# Patient Record
Sex: Male | Born: 1965 | Race: White | Hispanic: No | Marital: Married | Smoking: Never smoker
Health system: Southern US, Community
[De-identification: ages and names within clinical notes are randomized; demographics above are authoritative.]

## PROBLEM LIST (undated history)

## (undated) DIAGNOSIS — M1990 Unspecified osteoarthritis, unspecified site: Secondary | ICD-10-CM

## (undated) DIAGNOSIS — F329 Major depressive disorder, single episode, unspecified: Secondary | ICD-10-CM

## (undated) DIAGNOSIS — J018 Other acute sinusitis: Secondary | ICD-10-CM

## (undated) DIAGNOSIS — R51 Headache: Secondary | ICD-10-CM

## (undated) DIAGNOSIS — E785 Hyperlipidemia, unspecified: Secondary | ICD-10-CM

## (undated) DIAGNOSIS — K429 Umbilical hernia without obstruction or gangrene: Secondary | ICD-10-CM

## (undated) DIAGNOSIS — I10 Essential (primary) hypertension: Secondary | ICD-10-CM

## (undated) DIAGNOSIS — I8390 Asymptomatic varicose veins of unspecified lower extremity: Secondary | ICD-10-CM

## (undated) DIAGNOSIS — Z973 Presence of spectacles and contact lenses: Secondary | ICD-10-CM

## (undated) DIAGNOSIS — R748 Abnormal levels of other serum enzymes: Secondary | ICD-10-CM

## (undated) DIAGNOSIS — M1710 Unilateral primary osteoarthritis, unspecified knee: Secondary | ICD-10-CM

## (undated) DIAGNOSIS — K219 Gastro-esophageal reflux disease without esophagitis: Secondary | ICD-10-CM

## (undated) DIAGNOSIS — N529 Male erectile dysfunction, unspecified: Secondary | ICD-10-CM

## (undated) HISTORY — DX: Major depressive disorder, single episode, unspecified: F32.9

## (undated) HISTORY — DX: Asymptomatic varicose veins of unspecified lower extremity: I83.90

## (undated) HISTORY — DX: Essential (primary) hypertension: I10

## (undated) HISTORY — DX: Other acute sinusitis: J01.80

## (undated) HISTORY — DX: Gastro-esophageal reflux disease without esophagitis: K21.9

## (undated) HISTORY — DX: Unilateral primary osteoarthritis, unspecified knee: M17.10

## (undated) HISTORY — PX: COLONOSCOPY WITH PROPOFOL: SHX5780

## (undated) HISTORY — DX: Abnormal levels of other serum enzymes: R74.8

## (undated) HISTORY — DX: Headache: R51

## (undated) HISTORY — DX: Hyperlipidemia, unspecified: E78.5

---

## 2005-07-14 ENCOUNTER — Encounter: Admission: RE | Admit: 2005-07-14 | Discharge: 2005-07-14 | Payer: Self-pay | Admitting: Family Medicine

## 2009-07-17 ENCOUNTER — Ambulatory Visit: Payer: Self-pay | Admitting: Internal Medicine

## 2009-07-17 DIAGNOSIS — R42 Dizziness and giddiness: Secondary | ICD-10-CM | POA: Insufficient documentation

## 2009-07-17 DIAGNOSIS — R51 Headache: Secondary | ICD-10-CM | POA: Insufficient documentation

## 2009-07-17 DIAGNOSIS — R519 Headache, unspecified: Secondary | ICD-10-CM | POA: Insufficient documentation

## 2009-07-17 DIAGNOSIS — K219 Gastro-esophageal reflux disease without esophagitis: Secondary | ICD-10-CM | POA: Insufficient documentation

## 2009-07-17 DIAGNOSIS — I10 Essential (primary) hypertension: Secondary | ICD-10-CM | POA: Insufficient documentation

## 2009-07-24 ENCOUNTER — Ambulatory Visit: Payer: Self-pay | Admitting: Internal Medicine

## 2009-07-24 LAB — CONVERTED CEMR LAB
ALT: 38 units/L (ref 0–53)
BUN: 16 mg/dL (ref 6–23)
Basophils Relative: 0.3 % (ref 0.0–3.0)
Bilirubin Urine: NEGATIVE
CO2: 30 meq/L (ref 19–32)
Calcium: 9.1 mg/dL (ref 8.4–10.5)
Chloride: 101 meq/L (ref 96–112)
Eosinophils Absolute: 0.3 10*3/uL (ref 0.0–0.7)
Hemoglobin: 14.1 g/dL (ref 13.0–17.0)
Leukocytes, UA: NEGATIVE
MCHC: 34.1 g/dL (ref 30.0–36.0)
MCV: 88.5 fL (ref 78.0–100.0)
Monocytes Relative: 14.4 % — ABNORMAL HIGH (ref 3.0–12.0)
Neutrophils Relative %: 38.7 % — ABNORMAL LOW (ref 43.0–77.0)
Nitrite: NEGATIVE
Platelets: 295 10*3/uL (ref 150.0–400.0)
Potassium: 3.9 meq/L (ref 3.5–5.1)
Total Bilirubin: 1 mg/dL (ref 0.3–1.2)
Triglycerides: 146 mg/dL (ref 0.0–149.0)
WBC: 5.7 10*3/uL (ref 4.5–10.5)

## 2009-08-01 ENCOUNTER — Encounter: Payer: Self-pay | Admitting: Internal Medicine

## 2009-10-16 ENCOUNTER — Ambulatory Visit: Payer: Self-pay | Admitting: Internal Medicine

## 2009-10-16 DIAGNOSIS — J018 Other acute sinusitis: Secondary | ICD-10-CM | POA: Insufficient documentation

## 2009-10-16 DIAGNOSIS — E785 Hyperlipidemia, unspecified: Secondary | ICD-10-CM | POA: Insufficient documentation

## 2009-10-16 LAB — CONVERTED CEMR LAB
ALT: 32 units/L (ref 0–53)
Albumin: 4.8 g/dL (ref 3.5–5.2)
Alkaline Phosphatase: 126 units/L — ABNORMAL HIGH (ref 39–117)
HDL: 28 mg/dL — ABNORMAL LOW (ref >39)
Indirect Bilirubin: 0.4 mg/dL (ref 0.0–0.9)
LDL Cholesterol: 90 mg/dL (ref 0–99)
Total Bilirubin: 0.5 mg/dL (ref 0.3–1.2)
Triglycerides: 163 mg/dL — ABNORMAL HIGH (ref <150)
VLDL: 33 mg/dL (ref 0–40)

## 2009-10-19 ENCOUNTER — Encounter: Payer: Self-pay | Admitting: Internal Medicine

## 2009-10-20 ENCOUNTER — Telehealth: Payer: Self-pay | Admitting: Internal Medicine

## 2009-10-20 ENCOUNTER — Encounter: Payer: Self-pay | Admitting: Internal Medicine

## 2009-10-20 DIAGNOSIS — R748 Abnormal levels of other serum enzymes: Secondary | ICD-10-CM | POA: Insufficient documentation

## 2010-04-01 ENCOUNTER — Encounter: Payer: Self-pay | Admitting: Internal Medicine

## 2010-04-16 ENCOUNTER — Ambulatory Visit: Payer: Self-pay | Admitting: Internal Medicine

## 2010-04-16 DIAGNOSIS — IMO0002 Reserved for concepts with insufficient information to code with codable children: Secondary | ICD-10-CM | POA: Insufficient documentation

## 2010-04-16 DIAGNOSIS — I8390 Asymptomatic varicose veins of unspecified lower extremity: Secondary | ICD-10-CM | POA: Insufficient documentation

## 2010-04-16 DIAGNOSIS — M1710 Unilateral primary osteoarthritis, unspecified knee: Secondary | ICD-10-CM

## 2010-04-16 LAB — CONVERTED CEMR LAB
Albumin: 5.1 g/dL (ref 3.5–5.2)
Alkaline Phosphatase: 88 units/L (ref 39–117)
Calcium: 9.7 mg/dL (ref 8.4–10.5)
Cholesterol: 170 mg/dL (ref 0–200)
Creatinine, Ser: 1.04 mg/dL (ref 0.40–1.50)
Glucose, Bld: 99 mg/dL (ref 70–99)
HDL: 40 mg/dL (ref >39)
LDL Cholesterol: 80 mg/dL (ref 0–99)
Potassium: 4.2 meq/L (ref 3.5–5.3)
Total Bilirubin: 0.4 mg/dL (ref 0.3–1.2)
Total CHOL/HDL Ratio: 4.3

## 2010-04-17 ENCOUNTER — Encounter: Payer: Self-pay | Admitting: Internal Medicine

## 2010-06-10 ENCOUNTER — Encounter: Payer: Self-pay | Admitting: Internal Medicine

## 2010-06-10 ENCOUNTER — Ambulatory Visit: Payer: Self-pay | Admitting: Vascular Surgery

## 2010-09-15 ENCOUNTER — Ambulatory Visit: Payer: Self-pay | Admitting: Vascular Surgery

## 2010-09-15 ENCOUNTER — Encounter: Payer: Self-pay | Admitting: Internal Medicine

## 2010-10-28 ENCOUNTER — Ambulatory Visit: Payer: Self-pay | Admitting: Vascular Surgery

## 2010-11-04 ENCOUNTER — Ambulatory Visit: Payer: Self-pay | Admitting: Vascular Surgery

## 2010-11-05 ENCOUNTER — Encounter: Payer: Self-pay | Admitting: Internal Medicine

## 2011-01-05 NOTE — Assessment & Plan Note (Signed)
Summary: 6 mo.f/u- jr   Vital Signs:  Patient profile:   45 year old male Height:      72 inches Weight:      252.75 pounds BMI:     34.40 O2 Sat:      99 % on Room air Temp:     98.1 degrees F oral Pulse rate:   62 / minute Pulse rhythm:   regular Resp:     16 per minute BP sitting:   118 / 72  (right arm) Cuff size:   large  Vitals Entered By: Glendell Docker CMA (Apr 16, 2010 4:08 PM)  O2 Flow:  Room air CC: Rm 2- 6 Month Follow up diseaese management, CHF Management   Primary Care Provider:  DThomos Lemons DO  CC:  Rm 2- 6 Month Follow up diseaese management and CHF Management.  History of Present Illness:  Hyperlipidemia Follow-Up      This is a 45 year old man who presents for Hyperlipidemia follow-up.  The patient denies muscle aches and GI upset.  The patient denies the following symptoms: chest pain/pressure.  Compliance with medications (by patient report) has been near 100%.  Dietary compliance has been good.  The patient reports exercising 3-4X per week.  Adjunctive measures currently used by the patient include weight reduction.  he has lost approximately 40 pounds since 2010  Right varicose veins - legs feel achy and varicose veins are warm to touch  he c/o occ right knee pain, worse with prolonged standing, no swelling, no redness he has been taking daily ibuprofen for years  he complains he had been urinating last night No associated urinary symptoms He has been taking over-the-counter allergy medication   Preventive Screening-Counseling & Management  Alcohol-Tobacco     Smoking Status: never  Allergies (verified): No Known Drug Allergies  Past History:  Past Medical History: Dizziness or vertigo GERD Headache   Hypertension   Lower extremity Varicosities  Past Surgical History: Denies surgical history        Family History: Family History of Alcoholism/Addiction- Father Family History High cholesterol-mother Family History Hypertension-  mother and maternal grand parents Family History Lung cancer Maternal grandafather age 53 Family History of Stroke paternal grandfatther       Social History: Occupation: Network engineer) Married 7 years 2 daughters 5, 8  2 son 4 and 18 months      Impression & Recommendations:  Problem # 1:  ALKALINE PHOSPHATASE, ELEVATED (ICD-88.13) 45 year old white male with isolated elevation at alkaline phosphatase. Repeat LFTs.   check GGT if persistent abnormality, consider liver ultrasound Orders: T-Hepatic Function ([id number]) T- * Misc. Laboratory test [phone])  Problem # 2:  HYPERLIPIDEMIA (ICD-272.4) patient previously taking simvastatin for primary prevention. Patient able to make significant lifestyle changes including 40 pound weight loss. Discontinue simvastatin.  Nonpharmacologic treatments for hyperlipidemia reviewed.  The following medications were removed from the medication list:    Simvastatin 20 Mg Tabs (Simvastatin) ..... 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 Refer to vein specialist Orders: Vascular Clinic (Vascular)  Problem # 4:  DEGENERATIVE JOINT DISEASE, KNEE (ICD-715.96) patient experiencing intermittent right knee stiffness. I suspect mild degenerative joint disease. Patient advised against long-term use of NSAIDs. Patient advised to use ibuprofen only as needed.    Complete Medication List: 1)  Zantac 150 Mg Tabs (Ranitidine hcl) .... Take  1 tablet by mouth two times a day  Other Orders: T-Basic Metabolic Panel [id number])  CHF Assessment/Plan:      The patient's current weight is 252.75 pounds.  His previous weight was 266 pounds.     Review of Systems  The patient denies fever, chest pain, and dyspnea on exertion.    Physical Exam  General:  alert, well-developed, and well-nourished.    Lungs:  normal respiratory effort and normal breath sounds.   Heart:  normal rate, regular rhythm, no murmur, and no gallop.   Msk:  right knee-normal ROM, no joint tenderness, no joint swelling, no joint warmth, no redness over joints, and no joint instability.   Skin:  large varicosities right lower extremity/right thigh no redness, mild tenderness   Patient Instructions: 1)  Please schedule a follow-up appointment in 1 year 2)  Use metamucile once daily 3)  Take fish oil supplement daily 4)  Use ibuprofen only as needed  Current Allergies (reviewed today): No known allergies

## 2011-01-05 NOTE — Miscellaneous (Signed)
Summary: Procedure Consent/[facility]  Procedure Consent/[facility]   Imported By: Lester Circle 04/06/2010 09:24:28  _____________________________________________________________________  External Attachment:    Type:   Image     Comment:   External Document

## 2011-01-05 NOTE — Letter (Signed)
Summary: [facility]  [facility] [place]   Imported By: Lanelle Bal 11/12/2010 16:08:43  _____________________________________________________________________  External Attachment:    Type:   Image     Comment:   External Document

## 2011-01-05 NOTE — Letter (Signed)
Summary: [facility]  [facility] [place]   Imported By: Lanelle Bal 10/12/2010 13:37:41  _____________________________________________________________________  External Attachment:    Type:   Image     Comment:   External Document

## 2011-01-05 NOTE — Letter (Signed)
   [place] [facility] [address] [place], [place]  [address]  [place] Phone: [phone]      Apr 17, 2010   Angel Merritt [address] [place], [place] [address]  RE:  LAB RESULTS  Dear  [name],  The following is an interpretation of your most recent lab tests.  Please take note of any instructions provided or changes to medications that have resulted from your lab work.  ELECTROLYTES:  Good - no changes needed  KIDNEY FUNCTION TESTS:  Good - no changes needed  LIVER FUNCTION TESTS:  Good - no changes needed  LIPID PANEL:  Stable - no changes needed Triglyceride: 248   Cholesterol: 170   LDL: 80   HDL: 40   Chol/HDL%:  4.3 Ratio   Triglycerides elevated - non fasting blood work       Sincerely Yours,    Dr. Thomos Lemons

## 2011-01-05 NOTE — Consult Note (Signed)
Summary: [facility]  [facility] [place]   Imported By: Lanelle Bal 06/30/2010 10:56:42  _____________________________________________________________________  External Attachment:    Type:   Image     Comment:   External Document

## 2011-04-20 NOTE — Procedures (Signed)
DUPLEX DEEP VENOUS EXAM - LOWER EXTREMITY   INDICATION:  One week post right greater saphenous vein ELAS and  phlebectomies.   HISTORY:  Edema:  No.  Trauma/Surgery:  Right greater saphenous vein ELAS on 10/28/10 and  phlebectomies.  Pain:  Yes.  PE:  No.  Previous DVT:  No.  Anticoagulants:  No.  Other:   DUPLEX EXAM:                CFV   SFV   PopV  PTV    GSV                R  L  R  L  R  L  R   L  R  L  Thrombosis    o     o     o     o      +  Spontaneous   +     +     +     +      o  Phasic        +     +     +     +      o  Augmentation  +     +     +     +      o  Compressible  +     +     +     +      o  Competent     +     +     +     +      o   Legend:  + - yes  o - no  p - partial  D - decreased   IMPRESSION:  Right leg appears to be negative for deep venous  thrombosis.  Greater saphenous vein is thrombosed due to ELAS.    _____________________________  Larina Earthly, M.D.   NT/MEDQ  D:  11/04/2010  T:  11/04/2010  Job:  [id number]

## 2011-04-20 NOTE — Consult Note (Signed)
NEW PATIENT CONSULTATION   Angel Merritt, Angel Merritt  DOB:  09-21-1966                                       06/10/2010  BJYNW#:[id number]   The patient presents today for evaluation of venous hypertension  varicosities, more so on his right leg than on his left leg.  He reports  these been present for quite some time and he has had progression over  the past several years both in the extensive nature of these and also  discomfort related to these.  He works as a Curator and stands for  prolonged shifts.  He reports increasing pain, aching and burning  sensation and swelling over the varicosities.  He does not have any  history of DVT or superficial thrombophlebitis and does not have any  history of bleeding.  His health is otherwise excellent with no major  medical problems.  He does take Zantac on a p.r.n. basis.  Otherwise he  is only on nutritional supplements.  He denies any cardiac, pulmonary  disease.   FAMILY HISTORY:  Significant for premature atherosclerotic disease in  his mother.   SOCIAL HISTORY:  He is married with 4 children.  He does not smoke or  drink alcohol.   REVIEW OF SYSTEMS:  Positive for weight loss down to 252 pounds.  He is  6 feet tall.  CARDIAC:  Negative.  PULMONARY:  Negative.  GI:  Negative.  GU:  Negative.  VASCULAR:  Positive for pain with prolonged standing and walking.  NEUROLOGIC:  Occasional dizziness.  MUSCULOSKELETAL:  Positive for joint pain.  PSYCHIATRIC:  Negative.  ENT:  Negative.  HEMATOLOGIC:  Negative.  SKIN:  Negative.   PHYSICAL EXAMINATION:  Well-developed, well-nourished white male  appearing his stated age.  Vital signs:  Blood pressure is 100/70, pulse  64, respirations 20.  HEENT:  Normal.  He has 2+ dorsalis pedis pulses  bilaterally.  Musculoskeletal:  Shows no major deformities or cyanosis.  Neurologic:  No focal weakness, paresthesias.  Skin:  Without ulcers or  rashes.  He does have marked saphenous  tributary varicosities beginning  in his medial thigh, extending through his knee and in his posterior  calf.  He underwent venous duplex today.  This does show reflux  throughout his great saphenous vein on the right.  On the left he does  have some mild reflux in the proximal segment only.  He does have deep  venous reflux as well on the right.   I discussed options with the patient.  I feel that his pain is related  to the superficial venous hypertension.  I discussed treatment options.  He has not worn compression garments and we have fitted today with thigh-  high graduated 20 to 30 mmHg compression garments.  He is instructed on  the use of these and to continue elevation and ibuprofen for discomfort.  We will see him again in 3 months for continued discussion.  I do feel  that he would be an excellent candidate for laser ablation of his  saphenous vein with stab phlebectomy of his multiple tributary  varicosities should he fail conservative treatment.  We will see him  again in 3 months.     Larina Earthly, M.D.  Electronically Signed   TFE/MEDQ  D:  06/10/2010  T:  06/10/2010  Job:  [id number]   cc:  Barbette Hair. Artist Pais, DO

## 2011-04-20 NOTE — Assessment & Plan Note (Signed)
OFFICE VISIT   Angel Merritt, Angel Merritt  DOB:  02/22/1966                                       09/15/2010  JYNWG#:[id number]   Patient presents today for continued discussion of his severe venous  hypertension in his right leg.  He was initially seen for formal duplex  evaluation in July 2011.  This revealed reflux throughout his right  great saphenous vein with multiple tributaries vessels extending off of  this.  He has worn his graduated compression stockings, and this has  provided no relief.  He continues to elevate legs when possible and does  take ibuprofen for discomfort.  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  He also reports  that yard work and household chores with squatting position are very  difficult due to leg pain and also reports prolonged standing on  concrete at work is difficult due to leg pain over the varicosities as  well.   His physical exam is unchanged.  He has a palpable dorsalis pedis pulse  on the right.  He has some marked tributary varicosities throughout his  right thigh extending into his calf.  On SonoSite visualization by  myself, this does arise from this great saphenous vein.   I feel that he has clearly failed conservative treatment and have  recommended laser ablation of his right great saphenous vein and stab  phlebectomy of multiple tributary varicosities.  He understands this,  and we will proceed at his convenience.  He understands this is an  outpatient procedure under local anesthesia.     Larina Earthly, M.D.  Electronically Signed   TFE/MEDQ  D:  09/15/2010  T:  09/16/2010  Job:  [id number]   cc:   Barbette Hair. Artist Pais, DO

## 2011-04-20 NOTE — Assessment & Plan Note (Signed)
OFFICE VISIT   Angel Merritt, Angel Merritt  DOB:  1966-06-18                                       10/28/2010  ZOXWR#:[id number]   Patient presents today for treatment of his right leg venous  hypertension.  He underwent successful laser ablation of his right great  saphenous vein from his level of his knee to just below the  saphenofemoral junction and stab phlebectomy of multiple tributary  varicosities over his thigh, popliteal fossa, and posterior calf.  He  had no immediate complication and was discharged to home.  He will be  seen again in 1 week for repeat duplex.     Larina Earthly, M.D.  Electronically Signed   TFE/MEDQ  D:  10/28/2010  T:  10/28/2010  Job:  [id number]

## 2011-04-20 NOTE — Procedures (Signed)
LOWER EXTREMITY VENOUS REFLUX EXAM   INDICATION:  Large ropey bulging painful varicose veins.   EXAM:  Using color-flow imaging and pulse Doppler spectral analysis, the  right and left common femoral, superficial femoral, popliteal, posterior  tibial, greater and lesser saphenous veins are evaluated.  There is  evidence suggesting deep venous insufficiency in the right and left  lower extremity.   The right and left saphenofemoral junction is not competent with reflux  of >500 mm per second.  The right GSV is not competent with reflux of  >500 mm per second with the caliber as described below.   The right and left proximal short saphenous vein demonstrates  competency.   GSV Diameter (used if found to be incompetent only)                                            Right         Left  Proximal Greater Saphenous Vein           0.70 cm       cm  Proximal-to-mid-thigh                     0.75 cm       cm  Mid thigh                                 0.56 cm       cm  Mid-distal thigh                          0.47 cm       cm  Distal thigh                              0.66 cm       cm  Knee                                      0.49 cm       cm   IMPRESSION:  1. The right greater saphenous vein reflux is >500 mm per second and      is identified with the calibers ranging from 0.70 cm to 0.49 cm      knee to groin.  2. The right greater saphenous vein is not aneurysmal.  3. The right greater saphenous vein is not tortuous.  4. The deep venous system is not competent with reflux of >500 mm per      second.  5. The right and left lesser saphenous veins are competent.   ___________________________________________  Larina Earthly, M.D.   NT/MEDQ  D:  06/10/2010  T:  06/10/2010  Job:  [id number]

## 2011-04-23 NOTE — Assessment & Plan Note (Signed)
OFFICE VISIT   Angel Merritt, Angel Merritt  DOB:  Nov 17, 1966                                       11/05/2010  ZOXWR#:[id number]   The patient presented today for 1-week followup of the laser ablation of  his right great saphenous vein from his proximal calf to just below the  saphenofemoral junction and stab phlebectomy of multiple tributaries  varicosities.  He did extremely well with minimal discomfort and  moderate amount of bruising following the procedure.  He has been  compliant wearing his compression garments as directed.  His stab wound  sites all look quite good and he has minimal distal swelling.  He  underwent repeat venous duplex today and this shows successful closure  of his saphenous vein throughout the treated area and no evidence of  DVT.  I am quite pleased with his result as is Mr. Ayoub.  He will  notify us should he develop any difficulty in the future.     Larina Earthly, M.D.  Electronically Signed   TFE/MEDQ  D:  11/04/2010  T:  11/05/2010  Job:  [id number]   cc:   Barbette Hair. Artist Pais, DO

## 2013-05-23 ENCOUNTER — Encounter: Payer: Self-pay | Admitting: Family

## 2013-05-23 ENCOUNTER — Ambulatory Visit (INDEPENDENT_AMBULATORY_CARE_PROVIDER_SITE_OTHER): Payer: Managed Care, Other (non HMO) | Admitting: Family

## 2013-05-23 VITALS — BP 128/80 | HR 77 | Temp 98.6°F | Ht 69.0 in | Wt 297.8 lb

## 2013-05-23 DIAGNOSIS — Z0000 Encounter for general adult medical examination without abnormal findings: Secondary | ICD-10-CM | POA: Insufficient documentation

## 2013-05-23 LAB — HEPATIC FUNCTION PANEL
Bilirubin, Direct: 0.1 mg/dL (ref 0.0–0.3)
Total Bilirubin: 0.4 mg/dL (ref 0.3–1.2)
Total Protein: 7.1 g/dL (ref 6.0–8.3)

## 2013-05-23 LAB — URINALYSIS, ROUTINE W REFLEX MICROSCOPIC
Bilirubin Urine: NEGATIVE
Nitrite: NEGATIVE
Protein, ur: NEGATIVE mg/dL
Specific Gravity, Urine: 1.022 (ref 1.005–1.030)
pH: 5.5 (ref 5.0–8.0)

## 2013-05-23 LAB — CBC WITH DIFFERENTIAL/PLATELET
Eosinophils Absolute: 0.3 10*3/uL (ref 0.0–0.7)
Lymphocytes Relative: 34 % (ref 12–46)
MCH: 28.7 pg (ref 26.0–34.0)
MCV: 84 fL (ref 78.0–100.0)
Monocytes Relative: 10 % (ref 3–12)
Neutrophils Relative %: 52 % (ref 43–77)
Platelets: 337 10*3/uL (ref 150–400)
WBC: 9.1 10*3/uL (ref 4.0–10.5)

## 2013-05-23 LAB — TSH: TSH: 0.928 u[IU]/mL (ref 0.350–4.500)

## 2013-05-23 LAB — LIPID PANEL
Cholesterol: 221 mg/dL — ABNORMAL HIGH (ref 0–200)
HDL: 41 mg/dL (ref >39)
Total CHOL/HDL Ratio: 5.4 Ratio
VLDL: 46 mg/dL — ABNORMAL HIGH (ref 0–40)

## 2013-05-23 NOTE — Progress Notes (Signed)
Subjective:    Patient ID: Angel Merritt, male    DOB: Apr 07, 1966, 47 y.o.   MRN: [id number]  HPI  [name] "Angel Merritt" is a 48 yr old male who presents today to re-establish care. He was last seen by Dr. Artist Pais in 2010.    Immunizations: tetanus up to date.   Diet: poor diet Exercise:  Colonoscopy: dad diagnosed with colon cancer at 64.     Review of Systems  Constitutional: Positive for unexpected weight change.  HENT: Negative for congestion.   Eyes: Negative for visual disturbance.  Respiratory: Negative for cough and shortness of breath.   Cardiovascular: Negative for chest pain.  Gastrointestinal: Negative for nausea, vomiting and diarrhea.  Genitourinary: Negative for dysuria and frequency.  Musculoskeletal: Negative for myalgias and arthralgias.  Skin: Negative for rash.  Neurological: Negative for headaches.  Hematological: Negative for adenopathy.  Psychiatric/Behavioral:       Denies depression/anxiety   Past Medical History  Diagnosis Date  . VARICOSE VEINS, LOWER EXTREMITIES 04/16/2010    Qualifier: Diagnosis of  By: Nena Jordan   . RHINOSINUSITIS, ACUTE 10/16/2009    Qualifier: Diagnosis of  By: Nena Jordan   . HYPERTENSION 07/17/2009    Qualifier: Diagnosis of  By: Terrilee Croak CMA, Darlene    . HYPERLIPIDEMIA 10/16/2009    Qualifier: Diagnosis of  By: Nena Jordan   . HEADACHE 07/17/2009    Qualifier: Diagnosis of  By: Terrilee Croak CMA, Darlene    . GERD 07/17/2009    Qualifier: Diagnosis of  By: Terrilee Croak CMA, Darlene    . DEGENERATIVE JOINT DISEASE, KNEE 04/16/2010    Qualifier: Diagnosis of  By: Nena Jordan   . ALKALINE PHOSPHATASE, ELEVATED 10/20/2009    Qualifier: Diagnosis of  By: Nena Jordan     History   Social History  . Marital Status: Married    Spouse Name: N/A    Number of Children: N/A  . Years of Education: N/A   Occupational History  . Not on file.   Social History Main Topics  . Smoking status: Never Smoker   .  Smokeless tobacco: Not on file  . Alcohol Use: No  . Drug Use: No  . Sexually Active: Not on file   Other Topics Concern  . Not on file   Social History Narrative  . No narrative on file    No past surgical history on file.  No family history on file.  No Known Allergies  No current outpatient prescriptions on file prior to visit.   No current facility-administered medications on file prior to visit.    BP 128/80  Pulse 77  Temp(Src) 98.6 F (37 C) (Oral)  Ht 5\' 9"  (1.753 m)  Wt 297 lb 12.8 oz (135.081 kg)  BMI 43.96 kg/m2  SpO2 97%       Objective:   Physical Exam  Physical Exam  Constitutional: He is oriented to person, place, and time. Morbidly obese white male in NAD. No distress.  HENT:  Head: Normocephalic and atraumatic.  Right Ear: Tympanic membrane and ear canal normal.  Left Ear: Tympanic membrane and ear canal normal.  Mouth/Throat: Oropharynx is clear and moist.  Eyes: Pupils are equal, round, and reactive to light. No scleral icterus.  Neck: Normal range of motion. No thyromegaly present.  Cardiovascular: Normal rate and regular rhythm.   No murmur heard. Pulmonary/Chest: Effort normal and breath sounds normal. No respiratory distress. He has no wheezes.  He has no rales. He exhibits no tenderness.  Abdominal: Soft. Bowel sounds are normal. He exhibits no distension and no mass. There is no tenderness. There is no rebound and no guarding.  Musculoskeletal: He exhibits no edema.  Lymphadenopathy:    He has no cervical adenopathy.  Neurological: He is alert and oriented to person, place, and time. He has normal reflexes. He exhibits normal muscle tone. Coordination normal.  Skin: Skin is warm and dry.  Psychiatric: He has a normal mood and affect. His behavior is normal. Judgment and thought content normal.          Assessment & Plan:         Assessment & Plan:

## 2013-05-23 NOTE — Assessment & Plan Note (Signed)
We discussed importance of healthy diet, exercise and weight loss.  Work has a form that needs to be filled but he did not bring today. I have asked him to provide [place] with a copy.  Obtain fasting lab work. Immunizations up to date.

## 2013-05-23 NOTE — Patient Instructions (Addendum)
Please complete your lab work prior to leaving.  Work hard on diet exercise and weight loss.  You can download Myfitnesspal to your phone to help you keep track of calories and exercise. Please follow up in 1 year for a complete physical- sooner if problems or concerns.  Welcome back to [facility]!

## 2013-05-24 LAB — BASIC METABOLIC PANEL WITH GFR
CO2: 27 mEq/L (ref 19–32)
Chloride: 103 mEq/L (ref 96–112)
GFR, Est African American: >89 mL/min
Potassium: 4.2 mEq/L (ref 3.5–5.3)
Sodium: 139 mEq/L (ref 135–145)

## 2013-05-26 ENCOUNTER — Encounter: Payer: Self-pay | Admitting: Family

## 2014-05-24 ENCOUNTER — Encounter: Payer: Managed Care, Other (non HMO) | Admitting: Family

## 2014-05-29 ENCOUNTER — Ambulatory Visit (INDEPENDENT_AMBULATORY_CARE_PROVIDER_SITE_OTHER): Payer: Managed Care, Other (non HMO) | Admitting: Family

## 2014-05-29 ENCOUNTER — Encounter: Payer: Self-pay | Admitting: Family

## 2014-05-29 VITALS — BP 108/66 | HR 71 | Temp 98.7°F | Resp 16 | Ht 71.0 in | Wt 230.2 lb

## 2014-05-29 DIAGNOSIS — K429 Umbilical hernia without obstruction or gangrene: Secondary | ICD-10-CM | POA: Insufficient documentation

## 2014-05-29 DIAGNOSIS — Z0000 Encounter for general adult medical examination without abnormal findings: Secondary | ICD-10-CM

## 2014-05-29 DIAGNOSIS — I8390 Asymptomatic varicose veins of unspecified lower extremity: Secondary | ICD-10-CM

## 2014-05-29 DIAGNOSIS — I8392 Asymptomatic varicose veins of left lower extremity: Secondary | ICD-10-CM

## 2014-05-29 DIAGNOSIS — Z23 Encounter for immunization: Secondary | ICD-10-CM

## 2014-05-29 LAB — HEPATIC FUNCTION PANEL
ALBUMIN: 4.5 g/dL (ref 3.5–5.2)
ALT: 20 U/L (ref 0–53)
AST: 16 U/L (ref 0–37)
Alkaline Phosphatase: 95 U/L (ref 39–117)
BILIRUBIN INDIRECT: 0.5 mg/dL (ref 0.2–1.2)
Bilirubin, Direct: 0.1 mg/dL (ref 0.0–0.3)
Total Bilirubin: 0.6 mg/dL (ref 0.2–1.2)
Total Protein: 6.7 g/dL (ref 6.0–8.3)

## 2014-05-29 LAB — CBC WITH DIFFERENTIAL/PLATELET
Basophils Absolute: 0.1 10*3/uL (ref 0.0–0.1)
Basophils Relative: 1 % (ref 0–1)
Eosinophils Absolute: 0.3 10*3/uL (ref 0.0–0.7)
Eosinophils Relative: 4 % (ref 0–5)
HCT: 42.7 % (ref 39.0–52.0)
HEMOGLOBIN: 14.4 g/dL (ref 13.0–17.0)
Lymphocytes Relative: 34 % (ref 12–46)
Lymphs Abs: 2.1 10*3/uL (ref 0.7–4.0)
MCH: 29.3 pg (ref 26.0–34.0)
MCHC: 33.7 g/dL (ref 30.0–36.0)
MCV: 86.8 fL (ref 78.0–100.0)
MONO ABS: 0.7 10*3/uL (ref 0.1–1.0)
MONOS PCT: 11 % (ref 3–12)
NEUTROS ABS: 3.2 10*3/uL (ref 1.7–7.7)
Neutrophils Relative %: 50 % (ref 43–77)
PLATELETS: 332 10*3/uL (ref 150–400)
RBC: 4.92 MIL/uL (ref 4.22–5.81)
RDW: 13 % (ref 11.5–15.5)
WBC: 6.3 10*3/uL (ref 4.0–10.5)

## 2014-05-29 LAB — LIPID PANEL
CHOL/HDL RATIO: 4.1 ratio
Cholesterol: 176 mg/dL (ref 0–200)
HDL: 43 mg/dL (ref >39)
LDL Cholesterol: 111 mg/dL — ABNORMAL HIGH (ref 0–99)
TRIGLYCERIDES: 110 mg/dL (ref <150)
VLDL: 22 mg/dL (ref 0–40)

## 2014-05-29 LAB — BASIC METABOLIC PANEL WITH GFR
BUN: 14 mg/dL (ref 6–23)
CO2: 30 mEq/L (ref 19–32)
Calcium: 9.7 mg/dL (ref 8.4–10.5)
Chloride: 102 mEq/L (ref 96–112)
Creat: 0.93 mg/dL (ref 0.50–1.35)
GFR, Est Non African American: >89 mL/min
GLUCOSE: 99 mg/dL (ref 70–99)
Potassium: 4.3 mEq/L (ref 3.5–5.3)
SODIUM: 140 meq/L (ref 135–145)

## 2014-05-29 LAB — TSH: TSH: 0.684 u[IU]/mL (ref 0.350–4.500)

## 2014-05-29 NOTE — Progress Notes (Signed)
Pre visit review using our clinic review tool, if applicable. No additional management support is needed unless otherwise documented below in the visit note/SLS  

## 2014-05-29 NOTE — Progress Notes (Signed)
Subjective:    Patient ID: Angel Merritt, male    DOB: 1965/12/29, 48 y.o.   MRN: [id number]  HPI  Angel Merritt is a 48 yr old male who presents today for cpx.   Immunizations: due for Tdap Diet: healthy Exercise: working out every day  Wt Readings from Last 3 Encounters:  05/29/14 230 lb 4 oz (104.441 kg)  05/23/13 297 lb 12.8 oz (135.081 kg)  04/16/10 252 lb 12 oz (114.647 kg)     Review of Systems  Constitutional: Negative for unexpected weight change.  HENT: Negative for hearing loss and postnasal drip.   Eyes:       Reading glasses  Respiratory: Negative for cough and shortness of breath.   Cardiovascular: Negative for chest pain and leg swelling.  Gastrointestinal: Negative for nausea, vomiting and diarrhea.  Genitourinary: Negative for dysuria and frequency.  Musculoskeletal: Negative for arthralgias and myalgias.  Skin: Negative for rash.  Neurological: Negative for headaches.       Hands go numb at night  Hematological: Negative for adenopathy.  Psychiatric/Behavioral:       Denies depression/anxiety   Past Medical History  Diagnosis Date  . VARICOSE VEINS, LOWER EXTREMITIES 04/16/2010    Qualifier: Diagnosis of  By: Nena JordanYoo DO, D. Garrie   . RHINOSINUSITIS, ACUTE 10/16/2009    Qualifier: Diagnosis of  By: Nena JordanYoo DO, D. Akif   . HYPERTENSION 07/17/2009    Qualifier: Diagnosis of  By: Terrilee CroakKnight CMA, Darlene    . HYPERLIPIDEMIA 10/16/2009    Qualifier: Diagnosis of  By: Nena JordanYoo DO, D. Tank   . HEADACHE 07/17/2009    Qualifier: Diagnosis of  By: Terrilee CroakKnight CMA, Darlene    . GERD 07/17/2009    Qualifier: Diagnosis of  By: Terrilee CroakKnight CMA, Darlene    . DEGENERATIVE JOINT DISEASE, KNEE 04/16/2010    Qualifier: Diagnosis of  By: Nena JordanYoo DO, D. Marquinn   . ALKALINE PHOSPHATASE, ELEVATED 10/20/2009    Qualifier: Diagnosis of  By: Nena JordanYoo DO, D. Taejon     History   Social History  . Marital Status: Married    Spouse Name: N/A    Number of Children: N/A  . Years of Education: N/A    Occupational History  . Not on file.   Social History Main Topics  . Smoking status: Never Smoker   . Smokeless tobacco: Not on file  . Alcohol Use: No  . Drug Use: No  . Sexual Activity: Not on file   Other Topics Concern  . Not on file   Social History Narrative   4 children- [id number] (daughter), 539 (daughter), 8 son, 5 son   Works as [employer]   Married   Enjoys baseball, golf, gym    No past surgical history on file.  Family History  Problem Relation Age of Onset  . Colon cancer Father 5865  . Coronary artery disease Mother 6360  . Cancer Paternal Grandfather     brain tumor  . Coronary artery disease Maternal Grandmother 60    smoker    No Known Allergies  Current Outpatient Prescriptions on File Prior to Visit  Medication Sig Dispense Refill  . ibuprofen (ADVIL,MOTRIN) 200 MG tablet Take 200 mg by mouth every 6 (six) hours as needed for pain.      . Multiple Vitamin (MULTIVITAMIN) tablet Take 1 tablet by mouth daily.      . ranitidine (ZANTAC) 150 MG tablet Take 150 mg by mouth daily as needed.  No current facility-administered medications on file prior to visit.    BP 108/66  Pulse 71  Temp(Src) 98.7 F (37.1 C) (Oral)  Resp 16  Ht 5\' 11"  (1.803 m)  Wt 230 lb 4 oz (104.441 kg)  BMI 32.13 kg/m2  SpO2 98%       Objective:   Physical Exam  Physical Exam  Constitutional: He is oriented to person, place, and time. He appears well-developed and well-nourished. No distress.  HENT:  Head: Normocephalic and atraumatic.  Right Ear: Tympanic membrane and ear canal normal.  Left Ear: Tympanic membrane and ear canal normal.  Mouth/Throat: Oropharynx is clear and moist.  Eyes: Pupils are equal, round, and reactive to light. No scleral icterus.  Neck: Normal range of motion. No thyromegaly present.  Cardiovascular: Normal rate and regular rhythm.   No murmur heard. Pulmonary/Chest: Effort normal and breath sounds normal. No respiratory distress. He has no  wheezes. He has no rales. He exhibits no tenderness.  Abdominal: Soft. Bowel sounds are normal. He exhibits no distension and no mass. There is no tenderness. There is no rebound and no guarding. small reducible umbilical hernia Musculoskeletal: He exhibits no edema.  some varicose veins noted left leg Lymphadenopathy:    He has no cervical adenopathy.  Neurological: He is alert and oriented to person, place, and time. He has normal patellar reflexes. He exhibits normal muscle tone. Coordination normal.  Skin: Skin is warm and dry.  Psychiatric: He has a normal mood and affect. His behavior is normal. Judgment and thought content normal.          Assessment & Plan:         Assessment & Plan:

## 2014-05-29 NOTE — Assessment & Plan Note (Signed)
Small, recurrent.  We discussed referral to surgeon for elective repair.  He declines at this time. Discussed symptoms of hernia strangulation and pt advised to go the ED if this occurs.

## 2014-05-29 NOTE — Assessment & Plan Note (Addendum)
Tdap today. Commended pt on weight loss and exercise.  We discussed trying to get closer to 200 pounds.  He has high muscle mass so I don't think he will be able to get to a bmi of 25.  Obtain fasting labs.

## 2014-05-29 NOTE — Patient Instructions (Signed)
Keep up the great work with healthy diet/exercise. Follow up in 1 year, sooner if problems/concerns.

## 2014-05-29 NOTE — Assessment & Plan Note (Signed)
He has had stripping of the right leg.  Declines vascular referral for left leg at this time.

## 2014-05-30 ENCOUNTER — Encounter: Payer: Self-pay | Admitting: Family

## 2014-05-30 LAB — HIV ANTIBODY (ROUTINE TESTING W REFLEX): HIV: NONREACTIVE

## 2014-05-30 LAB — URINALYSIS, ROUTINE W REFLEX MICROSCOPIC
BILIRUBIN URINE: NEGATIVE
GLUCOSE, UA: NEGATIVE mg/dL
Hgb urine dipstick: NEGATIVE
Ketones, ur: NEGATIVE mg/dL
LEUKOCYTES UA: NEGATIVE
Nitrite: NEGATIVE
PROTEIN: NEGATIVE mg/dL
SPECIFIC GRAVITY, URINE: 1.01 (ref 1.005–1.030)
UROBILINOGEN UA: 0.2 mg/dL (ref 0.0–1.0)
pH: 7.5 (ref 5.0–8.0)

## 2015-05-12 ENCOUNTER — Telehealth: Payer: Self-pay | Admitting: Family

## 2015-05-12 NOTE — Telephone Encounter (Signed)
Pre Visit letter sent  °

## 2015-05-14 ENCOUNTER — Telehealth: Payer: Self-pay | Admitting: Family

## 2015-05-14 NOTE — Telephone Encounter (Signed)
Pre Visit letter sent  °

## 2015-06-02 ENCOUNTER — Ambulatory Visit (INDEPENDENT_AMBULATORY_CARE_PROVIDER_SITE_OTHER): Payer: Managed Care, Other (non HMO) | Admitting: Family

## 2015-06-02 ENCOUNTER — Encounter: Payer: Self-pay | Admitting: Family

## 2015-06-02 ENCOUNTER — Telehealth: Payer: Self-pay | Admitting: Family

## 2015-06-02 VITALS — BP 112/68 | HR 64 | Temp 98.3°F | Ht 71.0 in | Wt 226.5 lb

## 2015-06-02 DIAGNOSIS — Z0000 Encounter for general adult medical examination without abnormal findings: Secondary | ICD-10-CM | POA: Diagnosis not present

## 2015-06-02 DIAGNOSIS — I8392 Asymptomatic varicose veins of left lower extremity: Secondary | ICD-10-CM

## 2015-06-02 DIAGNOSIS — I83024 Varicose veins of left lower extremity with ulcer of heel and midfoot: Secondary | ICD-10-CM

## 2015-06-02 DIAGNOSIS — L97929 Non-pressure chronic ulcer of unspecified part of left lower leg with unspecified severity: Secondary | ICD-10-CM

## 2015-06-02 DIAGNOSIS — I83028 Varicose veins of left lower extremity with ulcer other part of lower leg: Secondary | ICD-10-CM

## 2015-06-02 DIAGNOSIS — I83022 Varicose veins of left lower extremity with ulcer of calf: Secondary | ICD-10-CM | POA: Diagnosis not present

## 2015-06-02 DIAGNOSIS — I83021 Varicose veins of left lower extremity with ulcer of thigh: Secondary | ICD-10-CM

## 2015-06-02 DIAGNOSIS — I83023 Varicose veins of left lower extremity with ulcer of ankle: Secondary | ICD-10-CM

## 2015-06-02 DIAGNOSIS — I83025 Varicose veins of left lower extremity with ulcer other part of foot: Secondary | ICD-10-CM

## 2015-06-02 DIAGNOSIS — I83029 Varicose veins of left lower extremity with ulcer of unspecified site: Secondary | ICD-10-CM

## 2015-06-02 LAB — BASIC METABOLIC PANEL
BUN: 21 mg/dL (ref 6–23)
CALCIUM: 9.7 mg/dL (ref 8.4–10.5)
CHLORIDE: 103 meq/L (ref 96–112)
CO2: 30 mEq/L (ref 19–32)
CREATININE: 1.03 mg/dL (ref 0.40–1.50)
GFR: 82.35 mL/min (ref >60.00)
GLUCOSE: 96 mg/dL (ref 70–99)
POTASSIUM: 4.3 meq/L (ref 3.5–5.1)
Sodium: 139 mEq/L (ref 135–145)

## 2015-06-02 LAB — CBC WITH DIFFERENTIAL/PLATELET
BASOS ABS: 0.1 10*3/uL (ref 0.0–0.1)
Basophils Relative: 1 % (ref 0.0–3.0)
Eosinophils Absolute: 0.2 10*3/uL (ref 0.0–0.7)
Eosinophils Relative: 3.9 % (ref 0.0–5.0)
HCT: 43.6 % (ref 39.0–52.0)
Hemoglobin: 14.8 g/dL (ref 13.0–17.0)
LYMPHS ABS: 2 10*3/uL (ref 0.7–4.0)
LYMPHS PCT: 31.5 % (ref 12.0–46.0)
MCHC: 34 g/dL (ref 30.0–36.0)
MCV: 89 fl (ref 78.0–100.0)
MONOS PCT: 10.7 % (ref 3.0–12.0)
Monocytes Absolute: 0.7 10*3/uL (ref 0.1–1.0)
NEUTROS PCT: 52.9 % (ref 43.0–77.0)
Neutro Abs: 3.3 10*3/uL (ref 1.4–7.7)
PLATELETS: 284 10*3/uL (ref 150.0–400.0)
RBC: 4.9 Mil/uL (ref 4.22–5.81)
RDW: 13 % (ref 11.5–15.5)
WBC: 6.4 10*3/uL (ref 4.0–10.5)

## 2015-06-02 LAB — URINALYSIS, ROUTINE W REFLEX MICROSCOPIC
Bilirubin Urine: NEGATIVE
HGB URINE DIPSTICK: NEGATIVE
KETONES UR: NEGATIVE
LEUKOCYTES UA: NEGATIVE
Nitrite: NEGATIVE
RBC / HPF: NONE SEEN (ref 0–?)
SPECIFIC GRAVITY, URINE: 1.01 (ref 1.000–1.030)
Total Protein, Urine: NEGATIVE
UROBILINOGEN UA: 0.2 (ref 0.0–1.0)
Urine Glucose: NEGATIVE
WBC UA: NONE SEEN (ref 0–?)
pH: 6.5 (ref 5.0–8.0)

## 2015-06-02 LAB — LIPID PANEL
CHOL/HDL RATIO: 4
Cholesterol: 177 mg/dL (ref 0–200)
HDL: 45.1 mg/dL (ref >39.00)
LDL CALC: 113 mg/dL — AB (ref 0–99)
NONHDL: 131.9
TRIGLYCERIDES: 93 mg/dL (ref 0.0–149.0)
VLDL: 18.6 mg/dL (ref 0.0–40.0)

## 2015-06-02 LAB — HEPATIC FUNCTION PANEL
ALK PHOS: 89 U/L (ref 39–117)
ALT: 25 U/L (ref 0–53)
AST: 19 U/L (ref 0–37)
Albumin: 4.5 g/dL (ref 3.5–5.2)
BILIRUBIN DIRECT: 0 mg/dL (ref 0.0–0.3)
TOTAL PROTEIN: 7.1 g/dL (ref 6.0–8.3)
Total Bilirubin: 0.7 mg/dL (ref 0.2–1.2)

## 2015-06-02 LAB — HIV ANTIBODY (ROUTINE TESTING W REFLEX): HIV 1&2 Ab, 4th Generation: NONREACTIVE

## 2015-06-02 LAB — TSH: TSH: 0.93 u[IU]/mL (ref 0.35–4.50)

## 2015-06-02 NOTE — Telephone Encounter (Signed)
Relation to pt: self  Call back number: [phone]  Reason for call:  Pt was seen today and wanted to send a friendly reminder stating he is in need of referral to eye doctor. Please advise

## 2015-06-02 NOTE — Progress Notes (Addendum)
Subjective:    Patient ID: Angel Merritt, male    DOB: 1966/06/12, 49 y.o.   MRN: [id number]  HPI Patient presents today for complete physical.  Immunizations:  Up to date Diet:  Healthy diet.  Trying to do weight watchers with his wife Exercise: 4 AM- goes to the gym x 2 hours.  Does cardio and weights Colonoscopy: will begin at 52 Dental:  Up to date Vision: due  Wt Readings from Last 3 Encounters:  06/02/15 226 lb 8 oz (102.74 kg)  05/29/14 230 lb 4 oz (104.441 kg)  05/23/13 297 lb 12.8 oz (135.081 kg)   Varicose vein- left anterior leg.  Has aching in the area.  Previous hx of varicose vein removal left leg in 2011. Wants to have left leg evaluated not due to discomfort.   Review of Systems  Constitutional: Negative for unexpected weight change.  HENT: Negative for rhinorrhea.   Respiratory: Negative for cough and shortness of breath.   Cardiovascular: Negative for chest pain.  Gastrointestinal: Negative for nausea, diarrhea, constipation and blood in stool.  Genitourinary: Negative for dysuria and frequency.  Musculoskeletal: Negative for myalgias and arthralgias.  Skin: Negative for rash.  Neurological: Negative for headaches.  Hematological: Negative for adenopathy.  Psychiatric/Behavioral:       Denies depression/anxiety       Past Medical History  Diagnosis Date  . VARICOSE VEINS, LOWER EXTREMITIES 04/16/2010    Qualifier: Diagnosis of  By: Nena JordanYoo DO, D. Mackson   . RHINOSINUSITIS, ACUTE 10/16/2009    Qualifier: Diagnosis of  By: Nena JordanYoo DO, D. Jayceion   . HYPERTENSION 07/17/2009    Qualifier: Diagnosis of  By: Terrilee CroakKnight CMA, Darlene    . HYPERLIPIDEMIA 10/16/2009    Qualifier: Diagnosis of  By: Nena JordanYoo DO, D. Dionne   . HEADACHE 07/17/2009    Qualifier: Diagnosis of  By: Terrilee CroakKnight CMA, Darlene    . GERD 07/17/2009    Qualifier: Diagnosis of  By: Terrilee CroakKnight CMA, Darlene    . DEGENERATIVE JOINT DISEASE, KNEE 04/16/2010    Qualifier: Diagnosis of  By: Nena JordanYoo DO, D. Zaylen   . ALKALINE  PHOSPHATASE, ELEVATED 10/20/2009    Qualifier: Diagnosis of  By: Nena JordanYoo DO, D. Hilary     History   Social History  . Marital Status: Married    Spouse Name: N/A  . Number of Children: N/A  . Years of Education: N/A   Occupational History  . Not on file.   Social History Main Topics  . Smoking status: Never Smoker   . Smokeless tobacco: Not on file  . Alcohol Use: No  . Drug Use: No  . Sexual Activity: Not on file   Other Topics Concern  . Not on file   Social History Narrative   4 children- 2712 (daughter), 529 (daughter), 8 son, 5 son   Works as [employer]   Married   Enjoys baseball, golf, gym    No past surgical history on file.  Family History  Problem Relation Age of Onset  . Colon cancer Father 7965  . Coronary artery disease Mother 2960  . Cancer Paternal Grandfather     brain tumor  . Coronary artery disease Maternal Grandmother 60    smoker    No Known Allergies  Current Outpatient Prescriptions on File Prior to Visit  Medication Sig Dispense Refill  . ibuprofen (ADVIL,MOTRIN) 200 MG tablet Take 200 mg by mouth every 6 (six) hours as needed for pain.    Marland Kitchen. OVER THE  COUNTER MEDICATION Weight Loss Supplement    . OVER THE COUNTER MEDICATION Creatine Supplement    . ranitidine (ZANTAC) 150 MG tablet Take 150 mg by mouth daily as needed.      No current facility-administered medications on file prior to visit.    BP 112/68 mmHg  Pulse 64  Temp(Src) 98.3 F (36.8 C) (Oral)  Ht  (1.803 m)  Wt 226 lb 8 oz (102.74 kg)  BMI 31.60 kg/m2  SpO2 98%    Objective:   Physical Exam  Physical Exam  Constitutional: He is oriented to person, place, and time. He appears well-developed and well-nourished. No distress.  HENT:  Head: Normocephalic and atraumatic.  Right Ear: Tympanic membrane and ear canal normal.  Left Ear: Tympanic membrane and ear canal normal.  Mouth/Throat: Oropharynx is clear and moist.  Eyes: Pupils are equal, round, and reactive to  light. No scleral icterus.  Neck: Normal range of motion. No thyromegaly present.  Cardiovascular: Normal rate and regular rhythm.  + varicose veins noted left anterior thigh No murmur heard. Pulmonary/Chest: Effort normal and breath sounds normal. No respiratory distress. He has no wheezes. He has no rales. He exhibits no tenderness.  Abdominal: Soft. Bowel sounds are normal. He exhibits no distension and no mass. There is no tenderness. There is no rebound and no guarding.  Musculoskeletal: He exhibits no edema.  Lymphadenopathy:    He has no cervical adenopathy.  Neurological: He is alert and oriented to person, place, and time. He has normal patellar reflexes. He exhibits normal muscle tone. Coordination normal.  Skin: Skin is warm and dry.  Psychiatric: He has a normal mood and affect. His behavior is normal. Judgment and thought content normal.          Assessment & Plan:          Assessment & Plan:  EKG is personally reviewed and shows normal sinus rhythm.

## 2015-06-02 NOTE — Telephone Encounter (Signed)
LVM advising pt of NP instructions

## 2015-06-02 NOTE — Telephone Encounter (Signed)
He can see optometrist of his choice.  He should not need referral.  I often use [facility] down the street.  [phone]

## 2015-06-02 NOTE — Telephone Encounter (Signed)
Was there a specific opthalmologist you would refer to?

## 2015-06-02 NOTE — Progress Notes (Signed)
Pre visit review using our clinic review tool, if applicable. No additional management support is needed unless otherwise documented below in the visit note. 

## 2015-06-02 NOTE — Patient Instructions (Addendum)
Schedule routine eye exam. Continue healthy diet, exercise, weight loss efforts.  You will be contacted about your referral to see the vascular specialist.

## 2015-06-02 NOTE — Assessment & Plan Note (Signed)
Deteriorated left. Refer back to Dr. Arbie Cookey

## 2015-06-02 NOTE — Assessment & Plan Note (Signed)
Immunizations reviewed and up to date.  Continue healthy diet, exercise, weight loss efforts.  Obtain routine labs.

## 2015-06-04 ENCOUNTER — Encounter: Payer: Self-pay | Admitting: Family

## 2015-06-04 NOTE — Addendum Note (Signed)
Addended by: Sandford Craze'SULLIVAN, Zilphia Kozinski on: 06/04/2015 10:12 PM   Modules accepted: Kipp BroodSmartSet

## 2015-06-10 ENCOUNTER — Other Ambulatory Visit: Payer: Self-pay | Admitting: *Deleted

## 2015-06-10 ENCOUNTER — Encounter: Payer: Self-pay | Admitting: Vascular Surgery

## 2015-06-10 DIAGNOSIS — I83893 Varicose veins of bilateral lower extremities with other complications: Secondary | ICD-10-CM

## 2015-06-11 ENCOUNTER — Other Ambulatory Visit: Payer: Self-pay | Admitting: Vascular Surgery

## 2015-06-11 ENCOUNTER — Ambulatory Visit (INDEPENDENT_AMBULATORY_CARE_PROVIDER_SITE_OTHER): Payer: Managed Care, Other (non HMO) | Admitting: Vascular Surgery

## 2015-06-11 ENCOUNTER — Ambulatory Visit (HOSPITAL_COMMUNITY)
Admission: RE | Admit: 2015-06-11 | Discharge: 2015-06-11 | Disposition: A | Discharge status: Home / Self Care | Payer: Managed Care, Other (non HMO) | Source: Ambulatory Visit | Attending: Vascular Surgery | Admitting: Vascular Surgery

## 2015-06-11 ENCOUNTER — Encounter: Payer: Self-pay | Admitting: Vascular Surgery

## 2015-06-11 VITALS — BP 117/71 | HR 70 | Ht 71.0 in | Wt 229.5 lb

## 2015-06-11 DIAGNOSIS — I83892 Varicose veins of left lower extremities with other complications: Secondary | ICD-10-CM | POA: Diagnosis not present

## 2015-06-11 DIAGNOSIS — I83022 Varicose veins of left lower extremity with ulcer of calf: Secondary | ICD-10-CM | POA: Diagnosis not present

## 2015-06-11 DIAGNOSIS — I83025 Varicose veins of left lower extremity with ulcer other part of foot: Secondary | ICD-10-CM

## 2015-06-11 DIAGNOSIS — I8392 Asymptomatic varicose veins of left lower extremity: Secondary | ICD-10-CM | POA: Insufficient documentation

## 2015-06-11 DIAGNOSIS — I83021 Varicose veins of left lower extremity with ulcer of thigh: Secondary | ICD-10-CM | POA: Diagnosis not present

## 2015-06-11 DIAGNOSIS — I83028 Varicose veins of left lower extremity with ulcer other part of lower leg: Secondary | ICD-10-CM

## 2015-06-11 DIAGNOSIS — I83023 Varicose veins of left lower extremity with ulcer of ankle: Secondary | ICD-10-CM | POA: Diagnosis not present

## 2015-06-11 DIAGNOSIS — I83029 Varicose veins of left lower extremity with ulcer of unspecified site: Secondary | ICD-10-CM

## 2015-06-11 DIAGNOSIS — I83893 Varicose veins of bilateral lower extremities with other complications: Secondary | ICD-10-CM

## 2015-06-11 DIAGNOSIS — L97929 Non-pressure chronic ulcer of unspecified part of left lower leg with unspecified severity: Principal | ICD-10-CM

## 2015-06-11 DIAGNOSIS — I83024 Varicose veins of left lower extremity with ulcer of heel and midfoot: Secondary | ICD-10-CM

## 2015-06-11 NOTE — Progress Notes (Signed)
Patient name: Angel AlbaRobert Plemons MRN: [id number] DOB: 29-Apr-1966 Sex: male   Referred by: Peggyann Juba'Sullivan  Reason for referral:  Chief Complaint  Patient presents with  . New Evaluation    last seen 11/05/2010 post laser R leg    HISTORY OF PRESENT ILLNESS: Patient resents today for discussion of venous pathology in his left leg. He has multiple large painful tributary varicosities over his anterior thigh and medial calf. He does a great deal of standing at work and reports severe pain associated with this. He has worn compression garments in the past. Has not to worn them recently regarding this venous pathology. Does not have any history of DVT. He is well-known to me from a prior laser ablation and stab phlebectomy of several difficulty with right leg saphenous vein issues in 2011. At that treatment with ablation of his great saphenous vein and stab phlebectomy of multiple tributary varicosities. He denies any recurrent symptoms on the left.  Past Medical History  Diagnosis Date  . VARICOSE VEINS, LOWER EXTREMITIES 04/16/2010    Qualifier: Diagnosis of  By: Nena JordanYoo DO, D. Anvay   . RHINOSINUSITIS, ACUTE 10/16/2009    Qualifier: Diagnosis of  By: Nena JordanYoo DO, D. Michai   . HYPERTENSION 07/17/2009    Qualifier: Diagnosis of  By: Terrilee CroakKnight CMA, Darlene    . HYPERLIPIDEMIA 10/16/2009    Qualifier: Diagnosis of  By: Nena JordanYoo DO, D. Baer   . HEADACHE 07/17/2009    Qualifier: Diagnosis of  By: Terrilee CroakKnight CMA, Darlene    . GERD 07/17/2009    Qualifier: Diagnosis of  By: Terrilee CroakKnight CMA, Darlene    . DEGENERATIVE JOINT DISEASE, KNEE 04/16/2010    Qualifier: Diagnosis of  By: Nena JordanYoo DO, D. Guenther   . ALKALINE PHOSPHATASE, ELEVATED 10/20/2009    Qualifier: Diagnosis of  By: Nena JordanYoo DO, D. Sedrick     History reviewed. No pertinent past surgical history.  History   Social History  . Marital Status: Married    Spouse Name: N/A  . Number of Children: N/A  . Years of Education: N/A   Occupational History  . Not on file.     Social History Main Topics  . Smoking status: Never Smoker   . Smokeless tobacco: Not on file  . Alcohol Use: No  . Drug Use: No  . Sexual Activity: Not on file   Other Topics Concern  . Not on file   Social History Narrative   4 children- [id number] (daughter), 449 (daughter), 8 son, 5 son   Works as [employer]ic   Married   Enjoys baseball, golf, gym    Family History  Problem Relation Age of Onset  . Colon cancer Father 3165  . Cancer Father   . Coronary artery disease Mother 7160  . Heart disease Mother   . Cancer Paternal Grandfather     brain tumor  . Coronary artery disease Maternal Grandmother 60    smoker    Allergies as of 06/11/2015  . (No Known Allergies)    Current Outpatient Prescriptions on File Prior to Visit  Medication Sig Dispense Refill  . ibuprofen (ADVIL,MOTRIN) 200 MG tablet Take 200 mg by mouth every 6 (six) hours as needed for pain.    . Multiple Vitamins-Minerals (MULTIVITAMIN ADULT PO) Take by mouth.    Marland Kitchen. OVER THE COUNTER MEDICATION Weight Loss Supplement    . OVER THE COUNTER MEDICATION Creatine Supplement    . ranitidine (ZANTAC) 150 MG tablet Take 150 mg by mouth daily  as needed.      No current facility-administered medications on file prior to visit.     REVIEW OF SYSTEMS:  Positives indicated with an "X"  CARDIOVASCULAR:   chest pain    chest pressure    palpitations    orthopnea    dyspnea on exertion    claudication    rest pain    DVT    phlebitis PULMONARY:    productive cough    asthma    wheezing NEUROLOGIC:    weakness   paresthesias   aphasia   amaurosis   dizziness HEMATOLOGIC:    bleeding problems    clotting disorders MUSCULOSKELETAL:   joint pain    joint swelling GASTROINTESTINAL:   blood in stool    hematemesis GENITOURINARY:    dysuria    hematuria PSYCHIATRIC:   history of major depression INTEGUMENTARY:   rashes   ulcers CONSTITUTIONAL:  [  ] fever    chills  PHYSICAL EXAMINATION:  General: The patient is a well-nourished male, in no acute distress. Vital signs are BP 117/71 mmHg  Pulse 70  Ht  (1.803 m)  Wt 229 lb 8 oz (104.101 kg)  BMI 32.02 kg/m2  SpO2 97% Pulmonary: There is a good air exchange  Abdomen: Soft and non-tender  Musculoskeletal: There are no major deformities.  There is no significant extremity pain. Neurologic: No focal weakness or paresthesias are detected, Skin: There are no ulcer or rashes noted. Psychiatric: The patient has normal affect. Cardiovascular: 2+ dorsalis pedis pulses bilaterally He has some small scattered telangiectasia on his right leg. Does have multiple large varicosities over the anterior surface of his left thigh and left medial calf.   [facility] Vascular Lab Studies:  Ordered and Independently Reviewed shows reflux throughout his left great saphenous vein. No evidence of DVT.  I did image his veins myself. Right vein was noted to have ablation successfully of his great saphenous vein throughout the thigh. 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 Has the same symptoms in his right leg prior to right leg ablation and stab phlebectomy 2011. Does have an excellent result in his right leg over 5 year. He is really started on thigh high 20-30 mm graduated compression garments today. Also instructed on elevation and ibuprofen for discomfort. We'll see him again in 3 months for continued discussion. Would be an excellent candidate for ablation of his great saphenous vein and left and stab phlebectomy should he fail conservative treatment    Corey Caulfield [facility] Office: [phone]

## 2015-07-29 ENCOUNTER — Encounter: Payer: Managed Care, Other (non HMO) | Admitting: Vascular Surgery

## 2015-07-29 ENCOUNTER — Encounter (HOSPITAL_COMMUNITY): Payer: Managed Care, Other (non HMO)

## 2015-09-16 ENCOUNTER — Ambulatory Visit: Payer: Managed Care, Other (non HMO) | Admitting: Vascular Surgery

## 2015-09-18 ENCOUNTER — Encounter: Payer: Self-pay | Admitting: Vascular Surgery

## 2015-09-23 ENCOUNTER — Encounter: Payer: Self-pay | Admitting: Vascular Surgery

## 2015-09-23 ENCOUNTER — Ambulatory Visit (INDEPENDENT_AMBULATORY_CARE_PROVIDER_SITE_OTHER): Payer: 59 | Admitting: Vascular Surgery

## 2015-09-23 VITALS — BP 128/79 | HR 73 | Temp 98.0°F | Resp 16 | Ht 71.0 in | Wt 236.5 lb

## 2015-09-23 DIAGNOSIS — I83899 Varicose veins of unspecified lower extremities with other complications: Secondary | ICD-10-CM | POA: Insufficient documentation

## 2015-09-23 DIAGNOSIS — I83892 Varicose veins of left lower extremities with other complications: Secondary | ICD-10-CM

## 2015-09-23 NOTE — Progress Notes (Signed)
Problems with Activities of Daily Living Secondary to Leg Pain  1. 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  This is very difficult for him due to leg pain.   2. [name] states he has an hour commute by car each way daily. This is very difficult for him due to leg pain. (worse with prolonged sitting)   3. [name] has difficulty exercising (especially doing squats) due to leg pain.     Failure of  Conservative Therapy:  1. Worn 20-30 mm Hg thigh high compression hose >3 months with no relief of symptoms.  2. Frequently elevates legs-no relief of symptoms  3. Taken Ibuprofen 600 Mg TID with no relief of symptoms.  Patient history of left leg discomfort despite compliance with his compression and elevation. I reimage his veins with SonoSite ultrasound. This does show enlargement and reflux in his great saphenous vein extending into multiple varicosities on his anterior thigh and anterior calf medial and posterior calf and thigh.  Feel that he is fairly can still clearly failed conservative therapy. Have recommended laser ablation of his left vein and stab phlebectomy of multiple tributary varicosities. He had similar treatment 5 years ago with excellent result in his right leg. We will proceed as soon as possible

## 2015-10-02 ENCOUNTER — Other Ambulatory Visit: Payer: Self-pay | Admitting: *Deleted

## 2015-10-02 ENCOUNTER — Telehealth: Payer: Self-pay | Admitting: Vascular Surgery

## 2015-10-02 DIAGNOSIS — I83892 Varicose veins of left lower extremities with other complications: Secondary | ICD-10-CM

## 2015-10-02 NOTE — Telephone Encounter (Signed)
-----   Message from Micah FlesherSonya D Rankin, RN sent at 10/02/2015  2:49 PM EDT ----- Regarding: scheduling Please schedule Angel Merritt for VV FU with Dr. Arbie CookeyEarly on 11-13-2015.  (no lab needed per Dr. Arbie CookeyEarly)

## 2015-11-04 ENCOUNTER — Encounter: Payer: Self-pay | Admitting: Vascular Surgery

## 2015-11-06 ENCOUNTER — Ambulatory Visit (INDEPENDENT_AMBULATORY_CARE_PROVIDER_SITE_OTHER): Payer: 59 | Admitting: Vascular Surgery

## 2015-11-06 ENCOUNTER — Encounter: Payer: Self-pay | Admitting: Vascular Surgery

## 2015-11-06 VITALS — BP 123/74 | HR 75 | Temp 97.1°F | Resp 16 | Ht 71.0 in | Wt 230.0 lb

## 2015-11-06 DIAGNOSIS — I83892 Varicose veins of left lower extremities with other complications: Secondary | ICD-10-CM | POA: Diagnosis not present

## 2015-11-06 HISTORY — PX: ENDOVENOUS ABLATION SAPHENOUS VEIN W/ LASER: SUR449

## 2015-11-06 NOTE — Progress Notes (Signed)
     Laser Ablation Procedure    Date: 11/06/2015   Liston Albaobert Zeller DOB:[birth year]  Consent signed: Yes    Surgeon:  Dr. Tawanna Coolerodd Keyosha Tiedt  Procedure: Laser Ablation: left Greater Saphenous Vein  BP 123/74 mmHg  Pulse 75  Temp(Src) 97.1 F (36.2 C) (Oral)  Resp 16  Ht 5\' 11"  (1.803 m)  Wt 230 lb (104.327 kg)  BMI 32.09 kg/m2  SpO2 98%  Tumescent Anesthesia: 700 cc 0.9% NaCl with 50 cc Lidocaine HCL with 1% Epi and 15 cc 8.4% NaHCO3  Local Anesthesia: 3 cc Lidocaine HCL and NaHCO3 (ratio 2:1)  15 watts continuous mode        Total energy: 2572 Joules   Total time: 2:51    Stab Phlebectomy: >20 INCISIONS Sites: Thigh and Calf  Left leg   Patient tolerated procedure well    Description of Procedure:  After marking the course of the secondary varicosities, the patient was placed on the operating table in the supine position, and the left leg was prepped and draped in sterile fashion.   Local anesthetic was administered and under ultrasound guidance the saphenous vein was accessed with a micro needle and guide wire; then the mirco puncture sheath was placed.  A guide wire was inserted saphenofemoral junction , followed by a 5 french sheath.  The position of the sheath and then the laser fiber below the junction was confirmed using the ultrasound.  Tumescent anesthesia was administered along the course of the saphenous vein using ultrasound guidance. The patient was placed in Trendelenburg position and protective laser glasses were placed on patient and staff, and the laser was fired at 15 watts continuous mode advancing 1-752mm/second for a total of 2572  joules.   For stab phlebectomies, local anesthetic was administered at the previously marked varicosities, and tumescent anesthesia was administered around the vessels.  Greater than 20 stab wounds were made using the tip of an 11 blade. And using the vein hook, the phlebectomies were performed using a hemostat to avulse the varicosities.   Adequate hemostasis was achieved.     Steri strips were applied to the stab wounds and ABD pads and thigh high compression stockings were applied.  Ace wrap bandages were applied over the phlebectomy sites and at the top of the saphenofemoral junction. Blood loss was less than 15 cc.  The patient ambulated out of the operating room having tolerated the procedure well.

## 2015-11-07 ENCOUNTER — Telehealth: Payer: Self-pay | Admitting: *Deleted

## 2015-11-07 ENCOUNTER — Encounter: Payer: Self-pay | Admitting: Vascular Surgery

## 2015-11-07 NOTE — Telephone Encounter (Signed)
    11/07/2015  Time: 11:13 AM   Patient Name: Liston Albaobert Muhl  Patient of: T.F. Early  Procedure:Laser Ablation left greater saphenous vein and stab phlebectomy >20 incisions left calf and thigh 11-06-2015    Reached patient at home and checked  His status  Yes    Comments/Actions Taken: Mr. [name] states no pain in left thigh.  States he experienced mild/moderate bleeding oozing (left thigh and calf) yesterday but none currently.  Denies heavy bleeding (left leg).  States the top of his left knee is sore (many large varicosities were removed from top of left knee on 11-06-2015).  Encouraged him to use ice compress over left knee, take Ibuprofen as directed, keep left leg elevated when sitting.  Mr. [name] states his left foot "seems to be a little numb."  Instructed him to remove Ace bandages, take ABD pads off that are sitting on top of compression hose and re wrap left calf and thigh with Ace bandages.  Reviewed all post procedural instructions with Mr. [name] and reminded him of VV follow up appointment with Dr. Arbie CookeyEarly on 11-13-2015.  Encouraged Mr. [name] to call [facility] if he has further questions or concerns.        @SIGNATURE @

## 2015-11-13 ENCOUNTER — Encounter: Payer: Self-pay | Admitting: Vascular Surgery

## 2015-11-13 ENCOUNTER — Ambulatory Visit (INDEPENDENT_AMBULATORY_CARE_PROVIDER_SITE_OTHER): Payer: 59 | Admitting: Vascular Surgery

## 2015-11-13 VITALS — BP 121/73 | HR 65 | Temp 98.0°F | Resp 18 | Ht 71.0 in | Wt 252.2 lb

## 2015-11-13 DIAGNOSIS — I83892 Varicose veins of left lower extremities with other complications: Secondary | ICD-10-CM

## 2015-11-13 NOTE — Progress Notes (Signed)
Here today for one-week follow-up of laser ablation of his left great saphenous vein. He has the usual amount of soreness over the ablation site. Minimal discomfort over the phlebectomy 7 mild to moderate bruising. He has been compliant with his compression garment.  He underwent the SonoSite ultrasound by myself today shows closure of his great saphenous vein from the knee up to just below the saphenofemoral junction. The saphenofemoral junction is patent into the first branch and there is no thrombus in the deep vein either with imaging or with color-flow Doppler.  Impression and plan excellent Guiseppe Flanagan result from laser ablation and stab phlebectomy for symptomatic left leg varicosities. We'll continue his compression garment for one additional week. Will see us on as-needed basis

## 2015-12-29 ENCOUNTER — Telehealth: Payer: Self-pay | Admitting: *Deleted

## 2015-12-29 NOTE — Telephone Encounter (Signed)
Returning Mr. Luhn telephone call regarding insurance billing question for office surgery.  Mr. Vondrasek is s/p 11-06-2015 endovenous laser ablation left greater saphenous vein and stab phlebectomy >20 incisions (left leg) by Gretta Began MD.  Mr. Viscomi states he received a bill from [facility] stating he owed $3500.  He is questioning that bill as I had given him benefits information that I had received from [facility] that he would owe $70 specialist copay for office surgery and then [employer] would pay 100% of eligible charges.  I gave him telephone reference # [phone] (of my conversation with Rosann Auerbach regarding his office surgery benefits).  This conversation took place on 09-24-2015 at 10:25AM and Candace gave me the specific benefit information mentioned above.  I also verified with Chad Cordial (handles [facility] office billing) that she had correctly billed the charges as place of service (" 11 " ) (office) .  Chad Cordial recommended and I suggested to Mr. Alpern that he call Rosann Auerbach and verify that Rosann Auerbach had processed his claim correctly with place of service ( "11") (office).  I also gave him [facility] [employer]ee telephone number [phone]) so he could follow up with them if needed.  Mr. Fermin verbalized understanding of the above information.

## 2016-06-04 ENCOUNTER — Encounter: Payer: Managed Care, Other (non HMO) | Admitting: Family

## 2016-06-07 ENCOUNTER — Encounter: Payer: Self-pay | Admitting: Family

## 2016-06-07 ENCOUNTER — Ambulatory Visit (INDEPENDENT_AMBULATORY_CARE_PROVIDER_SITE_OTHER): Payer: 59 | Admitting: Family

## 2016-06-07 VITALS — BP 124/80 | HR 65 | Temp 97.7°F | Resp 16 | Ht 71.0 in | Wt 246.8 lb

## 2016-06-07 DIAGNOSIS — R635 Abnormal weight gain: Secondary | ICD-10-CM | POA: Diagnosis not present

## 2016-06-07 DIAGNOSIS — Z309 Encounter for contraceptive management, unspecified: Secondary | ICD-10-CM

## 2016-06-07 DIAGNOSIS — Z0000 Encounter for general adult medical examination without abnormal findings: Secondary | ICD-10-CM

## 2016-06-07 DIAGNOSIS — Z3009 Encounter for other general counseling and advice on contraception: Secondary | ICD-10-CM

## 2016-06-07 DIAGNOSIS — K429 Umbilical hernia without obstruction or gangrene: Secondary | ICD-10-CM | POA: Diagnosis not present

## 2016-06-07 DIAGNOSIS — Z0001 Encounter for general adult medical examination with abnormal findings: Secondary | ICD-10-CM | POA: Diagnosis not present

## 2016-06-07 LAB — URINALYSIS, ROUTINE W REFLEX MICROSCOPIC
Bilirubin Urine: NEGATIVE
Hgb urine dipstick: NEGATIVE
Ketones, ur: NEGATIVE
LEUKOCYTES UA: NEGATIVE
Nitrite: NEGATIVE
SPECIFIC GRAVITY, URINE: 1.015 (ref 1.000–1.030)
TOTAL PROTEIN, URINE-UPE24: NEGATIVE
URINE GLUCOSE: NEGATIVE
Urobilinogen, UA: 0.2 (ref 0.0–1.0)
WBC, UA: NONE SEEN (ref 0–?)
pH: 6.5 (ref 5.0–8.0)

## 2016-06-07 LAB — HEPATIC FUNCTION PANEL
ALBUMIN: 4.3 g/dL (ref 3.5–5.2)
ALK PHOS: 81 U/L (ref 39–117)
ALT: 24 U/L (ref 0–53)
AST: 16 U/L (ref 0–37)
Bilirubin, Direct: 0 mg/dL (ref 0.0–0.3)
TOTAL PROTEIN: 6.9 g/dL (ref 6.0–8.3)
Total Bilirubin: 0.3 mg/dL (ref 0.2–1.2)

## 2016-06-07 LAB — LIPID PANEL
Cholesterol: 171 mg/dL (ref 0–200)
HDL: 43.3 mg/dL (ref >39.00)
LDL Cholesterol: 112 mg/dL — ABNORMAL HIGH (ref 0–99)
NONHDL: 127.52
Total CHOL/HDL Ratio: 4
Triglycerides: 80 mg/dL (ref 0.0–149.0)
VLDL: 16 mg/dL (ref 0.0–40.0)

## 2016-06-07 LAB — TSH: TSH: 1.23 u[IU]/mL (ref 0.35–4.50)

## 2016-06-07 LAB — CBC WITH DIFFERENTIAL/PLATELET
BASOS ABS: 0.1 10*3/uL (ref 0.0–0.1)
Basophils Relative: 0.9 % (ref 0.0–3.0)
Eosinophils Absolute: 0.3 10*3/uL (ref 0.0–0.7)
Eosinophils Relative: 4.3 % (ref 0.0–5.0)
HEMATOCRIT: 38.7 % — AB (ref 39.0–52.0)
Hemoglobin: 13.2 g/dL (ref 13.0–17.0)
LYMPHS PCT: 37.2 % (ref 12.0–46.0)
Lymphs Abs: 2.2 10*3/uL (ref 0.7–4.0)
MCHC: 34 g/dL (ref 30.0–36.0)
MCV: 88.4 fl (ref 78.0–100.0)
MONOS PCT: 13.2 % — AB (ref 3.0–12.0)
Monocytes Absolute: 0.8 10*3/uL (ref 0.1–1.0)
NEUTROS ABS: 2.7 10*3/uL (ref 1.4–7.7)
Neutrophils Relative %: 44.4 % (ref 43.0–77.0)
Platelets: 309 10*3/uL (ref 150.0–400.0)
RBC: 4.38 Mil/uL (ref 4.22–5.81)
RDW: 12.9 % (ref 11.5–15.5)
WBC: 6.1 10*3/uL (ref 4.0–10.5)

## 2016-06-07 LAB — BASIC METABOLIC PANEL
BUN: 22 mg/dL (ref 6–23)
CALCIUM: 9.3 mg/dL (ref 8.4–10.5)
CO2: 28 meq/L (ref 19–32)
Chloride: 105 mEq/L (ref 96–112)
Creatinine, Ser: 0.92 mg/dL (ref 0.40–1.50)
GFR: 93.41 mL/min (ref >60.00)
Glucose, Bld: 95 mg/dL (ref 70–99)
POTASSIUM: 4 meq/L (ref 3.5–5.1)
SODIUM: 140 meq/L (ref 135–145)

## 2016-06-07 NOTE — Patient Instructions (Addendum)
Please schedule a routine eye exam at your convenience. You will be contacted about your referral to Urology (for vasectomy) and your referral to the surgeon for your hernia. Complete lab work prior to leaving. Continue your work on healthy diet, exercise and weight loss.

## 2016-06-07 NOTE — Progress Notes (Signed)
Pre visit review using our clinic review tool, if applicable. No additional management support is needed unless otherwise documented below in the visit note. 

## 2016-06-07 NOTE — Addendum Note (Signed)
Addended by: Eustace QuailEABOLD, Demi Trieu J on: 06/07/2016 08:15 AM   Modules accepted: Orders

## 2016-06-07 NOTE — Progress Notes (Signed)
Subjective:    Patient ID: Angel Merritt, male    DOB: [birth year], 50 y.o.   MRN: [id number]  HPI  Patient presents today for complete physical.  Immunizations: Tdap 6/15- up to date.  Diet: working on healthy diet His weight at this time wast year was 226. Wt Readings from Last 3 Encounters:  06/07/16 246 lb 12.8 oz (111.948 kg)  11/13/15 252 lb 3.2 oz (114.397 kg)  11/06/15 230 lb (104.327 kg)  Exercise: daily- cardio and weights Colonoscopy: will begin at age 46 Vision: due Dental: up to date  Pt is requesting referral for vasectomy. He has 4 children and does not wish to have any more children. He is requesting that we check his testosterone level today. Denies issues with libido but wonders why he has had weight gain over the last year and wonders if it could be related to his testosterone.  Umbilical hernia- asymptomatic. Reports that he has met his deductible for the year and would like to have this repaired.   Review of Systems  Constitutional: Negative for unexpected weight change.  HENT: Negative for hearing loss and rhinorrhea.   Eyes: Negative for visual disturbance.  Respiratory:       Reports mild cough from allergies  Cardiovascular: Negative for leg swelling.  Gastrointestinal: Negative for diarrhea.       Some constipation- eating lots of fruits/veggies/water  Genitourinary: Negative for dysuria and frequency.  Musculoskeletal: Negative for myalgias and arthralgias.  Skin: Negative for rash.  Neurological: Negative for headaches.  Hematological: Negative for adenopathy.  Psychiatric/Behavioral:       Denies depression/anxiety   Past Medical History  Diagnosis Date  . VARICOSE VEINS, LOWER EXTREMITIES 04/16/2010    Qualifier: Diagnosis of  By: Wynona Luna   . RHINOSINUSITIS, ACUTE 10/16/2009    Qualifier: Diagnosis of  By: Wynona Luna   . HYPERTENSION 07/17/2009    Qualifier: Diagnosis of  By: Danelle Earthly CMA, Darlene    . HYPERLIPIDEMIA  10/16/2009    Qualifier: Diagnosis of  By: Wynona Luna   . HEADACHE 07/17/2009    Qualifier: Diagnosis of  By: Danelle Earthly CMA, Darlene    . GERD 07/17/2009    Qualifier: Diagnosis of  By: Danelle Earthly CMA, Darlene    . DEGENERATIVE JOINT DISEASE, KNEE 04/16/2010    Qualifier: Diagnosis of  By: Wynona Luna   . ALKALINE PHOSPHATASE, ELEVATED 10/20/2009    Qualifier: Diagnosis of  By: Wynona Luna   . Varicose veins      Social History   Social History  . Marital Status: Married    Spouse Name: N/A  . Number of Children: N/A  . Years of Education: N/A   Occupational History  . Not on file.   Social History Main Topics  . Smoking status: Never Smoker   . Smokeless tobacco: Not on file  . Alcohol Use: No  . Drug Use: No  . Sexual Activity: Not on file   Other Topics Concern  . Not on file   Social History Narrative   4 children- 66 (daughter), 54 (daughter), 8 son, 5 son   Works as Dealer   Married   Enjoys baseball, golf, gym    Past Surgical History  Procedure Laterality Date  . Endovenous ablation saphenous vein w/ laser Left 11-06-2015    endovenous laser ablation left greater saphenous vein and stab phlebectomies > 20 incisions  left leg  by Curt Jews MD  Family History  Problem Relation Age of Onset  . Colon cancer Father 76    died at 56 (following a hernia repair)  . Cancer Father   . Coronary artery disease Mother 21    (has 3 stents)  . Heart disease Mother   . Cancer Paternal Grandfather     brain tumor  . Coronary artery disease Maternal Grandmother 60    smoker    No Known Allergies  Current Outpatient Prescriptions on File Prior to Visit  Medication Sig Dispense Refill  . ibuprofen (ADVIL,MOTRIN) 200 MG tablet Take 200 mg by mouth as needed.     . Multiple Vitamins-Minerals (MULTIVITAMIN ADULT PO) Take by mouth.    Marland Kitchen OVER THE COUNTER MEDICATION Weight Loss Supplement    . OVER THE COUNTER MEDICATION Creatine Supplement    .  ranitidine (ZANTAC) 150 MG tablet Take 150 mg by mouth daily as needed.      No current facility-administered medications on file prior to visit.    BP 124/80 mmHg  Pulse 65  Temp(Src) 97.7 F (36.5 C) (Oral)  Resp 16  Ht 5' 11"  (1.803 m)  Wt 246 lb 12.8 oz (111.948 kg)  BMI 34.44 kg/m2  SpO2 98%       Objective:   Physical Exam  Physical Exam  Constitutional: He is oriented to person, place, and time. He appears well-developed and well-nourished. No distress.  HENT:  Head: Normocephalic and atraumatic.  Right Ear: Tympanic membrane and ear canal normal.  Left Ear: Tympanic membrane and ear canal normal.  Mouth/Throat: Oropharynx is clear and moist.  Eyes: Pupils are equal, round, and reactive to light. No scleral icterus.  Neck: Normal range of motion. No thyromegaly present.  Cardiovascular: Normal rate and regular rhythm.   No murmur heard. Pulmonary/Chest: Effort normal and breath sounds normal. No respiratory distress. He has no wheezes. He has no rales. He exhibits no tenderness.  Abdominal: Soft. Bowel sounds are normal. He exhibits no distension and no mass. There is no tenderness. There is no rebound and no guarding. He does have a small umbilical hernia which is easily reducible. Musculoskeletal: He exhibits no edema.  Lymphadenopathy:    He has no cervical adenopathy.  Neurological: He is alert and oriented to person, place, and time. He has normal patellar reflexes. He exhibits normal muscle tone. Coordination normal.  Skin: Skin is warm and dry.  Psychiatric: He has a normal mood and affect. His behavior is normal. Judgment and thought content normal.          Assessment & Plan:         Assessment & Plan:  Preventative Care-  Discussed healthy diet, exercise, weight loss. EKG tracing is personally reviewed.  EKG notes NSR.  No acute changes. Obtain routine lab work. Immunizations reviewed and up to date.  Umbilical hernia- will refer to general  surgeon for repair. We did discuss signs of hernia strangulation and he understands to go to the ER if this occurs.  Vasectomy Evaluation- pt requests referral for vasectomy. Will arrange.

## 2016-06-11 LAB — TESTOS,TOTAL,FREE AND SHBG (FEMALE)
Sex Hormone Binding Glob.: 42 nmol/L (ref 10–50)
TESTOSTERONE,TOTAL,LC/MS/MS: 465 ng/dL (ref 250–1100)
Testosterone, Free: 64.2 pg/mL (ref 35.0–155.0)

## 2016-06-12 ENCOUNTER — Encounter: Payer: Self-pay | Admitting: Family

## 2016-10-01 HISTORY — PX: VASECTOMY: SHX75

## 2017-06-10 ENCOUNTER — Encounter: Payer: 59 | Admitting: Family

## 2017-06-24 ENCOUNTER — Encounter: Payer: Self-pay | Admitting: Family

## 2017-06-24 ENCOUNTER — Ambulatory Visit (INDEPENDENT_AMBULATORY_CARE_PROVIDER_SITE_OTHER): Payer: 59 | Admitting: Family

## 2017-06-24 VITALS — BP 123/87 | HR 68 | Temp 98.0°F | Resp 20 | Ht 71.0 in | Wt 239.2 lb

## 2017-06-24 DIAGNOSIS — Z0000 Encounter for general adult medical examination without abnormal findings: Secondary | ICD-10-CM | POA: Diagnosis not present

## 2017-06-24 DIAGNOSIS — F418 Other specified anxiety disorders: Secondary | ICD-10-CM

## 2017-06-24 LAB — URINALYSIS, ROUTINE W REFLEX MICROSCOPIC
Bilirubin Urine: NEGATIVE
Hgb urine dipstick: NEGATIVE
Ketones, ur: NEGATIVE
Leukocytes, UA: NEGATIVE
Nitrite: NEGATIVE
SPECIFIC GRAVITY, URINE: 1.02 (ref 1.000–1.030)
Total Protein, Urine: NEGATIVE
URINE GLUCOSE: NEGATIVE
UROBILINOGEN UA: 0.2 (ref 0.0–1.0)
pH: 6 (ref 5.0–8.0)

## 2017-06-24 LAB — LIPID PANEL
CHOL/HDL RATIO: 4
Cholesterol: 207 mg/dL — ABNORMAL HIGH (ref 0–200)
HDL: 53.4 mg/dL (ref >39.00)
LDL Cholesterol: 129 mg/dL — ABNORMAL HIGH (ref 0–99)
NONHDL: 153.35
TRIGLYCERIDES: 120 mg/dL (ref 0.0–149.0)
VLDL: 24 mg/dL (ref 0.0–40.0)

## 2017-06-24 LAB — CBC WITH DIFFERENTIAL/PLATELET
BASOS PCT: 1.2 % (ref 0.0–3.0)
Basophils Absolute: 0.1 10*3/uL (ref 0.0–0.1)
EOS ABS: 0.2 10*3/uL (ref 0.0–0.7)
EOS PCT: 3.6 % (ref 0.0–5.0)
HEMATOCRIT: 44.1 % (ref 39.0–52.0)
HEMOGLOBIN: 14.7 g/dL (ref 13.0–17.0)
LYMPHS PCT: 43.2 % (ref 12.0–46.0)
Lymphs Abs: 2.3 10*3/uL (ref 0.7–4.0)
MCHC: 33.3 g/dL (ref 30.0–36.0)
MCV: 89.8 fl (ref 78.0–100.0)
MONOS PCT: 11.2 % (ref 3.0–12.0)
Monocytes Absolute: 0.6 10*3/uL (ref 0.1–1.0)
Neutro Abs: 2.2 10*3/uL (ref 1.4–7.7)
Neutrophils Relative %: 40.8 % — ABNORMAL LOW (ref 43.0–77.0)
Platelets: 280 10*3/uL (ref 150.0–400.0)
RBC: 4.92 Mil/uL (ref 4.22–5.81)
RDW: 12.5 % (ref 11.5–15.5)
WBC: 5.3 10*3/uL (ref 4.0–10.5)

## 2017-06-24 LAB — BASIC METABOLIC PANEL
BUN: 19 mg/dL (ref 6–23)
CHLORIDE: 103 meq/L (ref 96–112)
CO2: 29 meq/L (ref 19–32)
Calcium: 9.7 mg/dL (ref 8.4–10.5)
Creatinine, Ser: 0.93 mg/dL (ref 0.40–1.50)
GFR: 91.85 mL/min (ref >60.00)
Glucose, Bld: 103 mg/dL — ABNORMAL HIGH (ref 70–99)
POTASSIUM: 4.8 meq/L (ref 3.5–5.1)
SODIUM: 138 meq/L (ref 135–145)

## 2017-06-24 LAB — HEPATIC FUNCTION PANEL
ALBUMIN: 4.5 g/dL (ref 3.5–5.2)
ALT: 16 U/L (ref 0–53)
AST: 17 U/L (ref 0–37)
Alkaline Phosphatase: 78 U/L (ref 39–117)
BILIRUBIN DIRECT: 0.1 mg/dL (ref 0.0–0.3)
TOTAL PROTEIN: 6.9 g/dL (ref 6.0–8.3)
Total Bilirubin: 0.5 mg/dL (ref 0.2–1.2)

## 2017-06-24 LAB — TSH: TSH: 0.68 u[IU]/mL (ref 0.35–4.50)

## 2017-06-24 MED ORDER — ESCITALOPRAM OXALATE 10 MG PO TABS: ORAL_TABLET | ORAL | 0 refills | Status: DC

## 2017-06-24 NOTE — Progress Notes (Signed)
Subjective:    Patient ID: Angel Merritt, male    DOB: 03-15-1966, 51 y.o.   MRN: [id number]  HPI  Angel Merritt is a 51 yr old male who presents today initially scheduled for a physical but is reporting issues with anxiety.  His wife accompanies him today to the appointment. Pt reports that he is not sleeping well. Notes occasional panic attacks throughout the day.  Feels like he is doing ok at work- but wife reports that he came home yesterday and told her "I didn't do anything today, I just sat there." Had thoughts of suicide on Tuesday 7/17.  Reports that he had a plan, "I was going to pull out in front of an 18 wheeler." He states that he doesn't think he would actually have the guts to do it though because of his children. Wife reports that he is drinking 6 beers a night.  Wife who accompanies the patient today reports that she told him that she wants a divorce around [place]. His symptoms began after that time.  He has lost weight but wife states it is because "he is not eating."  He is doing some marriage counseling but no individual counseling.    Review of Systems    see HPI  Past Medical History:  Diagnosis Date  . ALKALINE PHOSPHATASE, ELEVATED 10/20/2009   Qualifier: Diagnosis of  By: Nena Jordan   . DEGENERATIVE JOINT DISEASE, KNEE 04/16/2010   Qualifier: Diagnosis of  By: Nena Jordan   . GERD 07/17/2009   Qualifier: Diagnosis of  By: Terrilee Croak CMA, Darlene    . HEADACHE 07/17/2009   Qualifier: Diagnosis of  By: Terrilee Croak CMA, Darlene    . HYPERLIPIDEMIA 10/16/2009   Qualifier: Diagnosis of  By: Nena Jordan   . HYPERTENSION 07/17/2009   Qualifier: Diagnosis of  By: Terrilee Croak CMA, Darlene    . RHINOSINUSITIS, ACUTE 10/16/2009   Qualifier: Diagnosis of  By: Nena Jordan   . Varicose veins   . VARICOSE VEINS, LOWER EXTREMITIES 04/16/2010   Qualifier: Diagnosis of  By: Nena Jordan      Social History   Social History  . Marital status: Married    Spouse  name: N/A  . Number of children: N/A  . Years of education: N/A   Occupational History  . Not on file.   Social History Main Topics  . Smoking status: Never Smoker  . Smokeless tobacco: Never Used  . Alcohol use 21.6 oz/week    36 Cans of beer per week  . Drug use: No  . Sexual activity: Not on file   Other Topics Concern  . Not on file   Social History Narrative   4 children- 43 (daughter), 49 (daughter), 8 son, 5 son   Works as Curator   Married   Enjoys baseball, golf, gym    Past Surgical History:  Procedure Laterality Date  . ENDOVENOUS ABLATION SAPHENOUS VEIN W/ LASER Left 11-06-2015   endovenous laser ablation left greater saphenous vein and stab phlebectomies > 20 incisions  left leg  by Gretta Began MD  . VASECTOMY  10/01/17    Family History  Problem Relation Age of Onset  . Colon cancer Father 31       died at 31 (following a hernia repair)  . Cancer Father   . Coronary artery disease Mother 62       (has 3 stents)  . Heart disease Mother   .  Cancer Paternal Grandfather        brain tumor  . Coronary artery disease Maternal Grandmother 60       smoker    No Known Allergies  Current Outpatient Prescriptions on File Prior to Visit  Medication Sig Dispense Refill  . ibuprofen (ADVIL,MOTRIN) 200 MG tablet Take 200 mg by mouth as needed.     . Multiple Vitamins-Minerals (MULTIVITAMIN ADULT PO) Take by mouth.    . ranitidine (ZANTAC) 150 MG tablet Take 150 mg by mouth daily as needed.     Marland Kitchen. OVER THE COUNTER MEDICATION Weight Loss Supplement    . OVER THE COUNTER MEDICATION Creatine Supplement     No current facility-administered medications on file prior to visit.     BP 123/87 (BP Location: Left Arm, Cuff Size: Large)   Pulse 68   Temp 98 F (36.7 C) (Oral)   Resp 20   Ht 5\' 11"  (1.803 m)   Wt 239 lb 3.2 oz (108.5 kg)   SpO2 100%   BMI 33.36 kg/m    Objective:   Physical Exam  Constitutional: He is oriented to person, place, and time. He  appears well-developed and well-nourished. No distress.  HENT:  Head: Normocephalic and atraumatic.  Musculoskeletal: He exhibits no edema.  Neurological: He is alert and oriented to person, place, and time.  Psychiatric: His behavior is normal. Judgment and thought content normal.  Flat affect          Assessment & Plan:  Scored 23 on PHQ-9  Depression/Anxiety- Severely depressed per PHQ-9.  I did ask the patient if he is having current suicidal thoughts and he denies current suicide ideation. When asked if he feels confident that he could call 911 if he does have suicidal thoughts again he says, "no."  I strongly recommended sending him to the ER for evaluation.  He declined. Wife also requesting that he not be sent to the ED. States they are leaving in the AM to go to the beach and she is going to be with him at all times for the next 1 week. They want to try medication as an outpatient.  I instructed pt to start 1/2 tablet once daily for 1 week and then increase to a full tablet once daily on week two as tolerated.  We discussed common side effects such as nausea, drowsiness and weight gain.  Also discussed rare but serious side effect of suicide ideation.  She is instructed to discontinue medication go directly to ED if this occurs.  Pt verbalizes understanding.  Advised him that he should be seen back in the office as soon as he returns from his beach trip and that should he have suicidal thoughts while on vacation that he should go to the ER at the beach.  He verbalizes understanding.  25 minutes spent with pt today. >50% of this time was spent counseling patient on his anxiety/depression.    Also, wife is requesting that pt have testosterone checked. Advised pt and wife that this may not be covered by insurance and she understands this.

## 2017-06-24 NOTE — Patient Instructions (Signed)
Please begin lexapro 10mg .  1/2 tab once daily for 1 week, then increase to a full tab once daily on week two. Go to ER or call 911 if you have thoughts of hurting yourself or others.

## 2017-06-27 LAB — TESTOSTERONE TOTAL,FREE,BIO, MALES
Albumin: 4.5 g/dL (ref 3.6–5.1)
Sex Hormone Binding: 41 nmol/L (ref 10–50)
TESTOSTERONE FREE: 55.3 pg/mL (ref 46.0–224.0)
TESTOSTERONE: 494 ng/dL (ref 250–827)
Testosterone, Bioavailable: 113.7 ng/dL (ref 110.0–575.0)

## 2017-06-28 ENCOUNTER — Encounter: Payer: Self-pay | Admitting: Family

## 2017-07-05 ENCOUNTER — Ambulatory Visit (INDEPENDENT_AMBULATORY_CARE_PROVIDER_SITE_OTHER): Payer: 59 | Admitting: Medical

## 2017-07-05 ENCOUNTER — Encounter: Payer: Self-pay | Admitting: Medical

## 2017-07-05 VITALS — BP 126/77 | HR 73 | Temp 98.5°F | Resp 16 | Ht 71.0 in | Wt 245.8 lb

## 2017-07-05 DIAGNOSIS — F329 Major depressive disorder, single episode, unspecified: Secondary | ICD-10-CM

## 2017-07-05 DIAGNOSIS — F32A Depression, unspecified: Secondary | ICD-10-CM

## 2017-07-05 NOTE — Progress Notes (Addendum)
Subjective:    Patient ID: Angel Merritt, male    DOB: 09-Jan-1966, 51 y.o.   MRN: [id number]  HPI  Pt in for follow up. He states he feels a lot better than before. No homicidal or suicidal ideations.  He takes lexapro at night. While on vacation all last week he slept well all past week except slept poorly last night. However he worked out last night at 8 pm to 9 pm. He usually works out at in morning.  Pt jobs is very stressful per wife.  On last visit some relationship issues and wife states counseling for  those issues on hold until his mood improves. Wife thinks he needs individual counseling first. Pt wife thinks the way he was raised may have caused him emotional issues  Pt states when younger he have been depressed but he never felt like he needed to go to MD or take medications.  Pt wife indicated pcp wanted him to get into pyschiatrist. [facility]  PCP note reviewed today. (He appears much better now)    Review of Systems  Constitutional: Negative for chills, fatigue and fever.  Respiratory: Negative for chest tightness, shortness of breath and wheezing.   Cardiovascular: Negative for chest pain and palpitations.  Gastrointestinal: Negative for abdominal distention, abdominal pain, constipation, nausea and vomiting.  Genitourinary: Negative for dysuria.  Musculoskeletal: Negative for back pain, joint swelling, myalgias and neck stiffness.  Skin: Negative for pallor and rash.  Neurological: Negative for dizziness, syncope, weakness, light-headedness and headaches.  Hematological: Negative for adenopathy. Does not bruise/bleed easily.  Psychiatric/Behavioral: Positive for dysphoric mood. Negative for behavioral problems, confusion, hallucinations, sleep disturbance and suicidal ideas. The patient is not nervous/anxious and is not hyperactive.        Much better over past week. No side effects from meds.       Objective:   Physical  Exam  General Mental Status- Alert. General Appearance- Not in acute distress.    Chest and Lung Exam Auscultation: Breath Sounds:-Normal.  Cardiovascular Auscultation:Rythm- Regular. Murmurs & Other Heart Sounds:Auscultation of the heart reveals- No Murmurs.   Neurologic Cranial Nerve exam:- CN III-XII intact(No nystagmus), symmetric smile. Strength:- 5/5 equal and symmetric strength both upper and lower extremities.  Past Medical History:  Diagnosis Date  . ALKALINE PHOSPHATASE, ELEVATED 10/20/2009   Qualifier: Diagnosis of  By: Nena JordanYoo DO, D. Harles   . DEGENERATIVE JOINT DISEASE, KNEE 04/16/2010   Qualifier: Diagnosis of  By: Nena JordanYoo DO, D. Alhaji   . GERD 07/17/2009   Qualifier: Diagnosis of  By: Terrilee CroakKnight CMA, Darlene    . HEADACHE 07/17/2009   Qualifier: Diagnosis of  By: Terrilee CroakKnight CMA, Darlene    . HYPERLIPIDEMIA 10/16/2009   Qualifier: Diagnosis of  By: Nena JordanYoo DO, D. Bret   . HYPERTENSION 07/17/2009   Qualifier: Diagnosis of  By: Terrilee CroakKnight CMA, Darlene    . RHINOSINUSITIS, ACUTE 10/16/2009   Qualifier: Diagnosis of  By: Nena JordanYoo DO, D. Ranell   . Varicose veins   . VARICOSE VEINS, LOWER EXTREMITIES 04/16/2010   Qualifier: Diagnosis of  By: Nena JordanYoo DO, D. Zakkery      Social History   Social History  . Marital status: Married    Spouse name: N/A  . Number of children: N/A  . Years of education: N/A   Occupational History  . Not on file.   Social History Main Topics  . Smoking status: Never Smoker  . Smokeless tobacco: Never Used  .  Alcohol use 21.6 oz/week    36 Cans of beer per week  . Drug use: No  . Sexual activity: Not on file   Other Topics Concern  . Not on file   Social History Narrative   4 children- 6612 (daughter), 479 (daughter), 8 son, 5 son   Works as Curatormechanic   Married   Enjoys baseball, golf, gym    Past Surgical History:  Procedure Laterality Date  . ENDOVENOUS ABLATION SAPHENOUS VEIN W/ LASER Left 11-06-2015   endovenous laser ablation left greater  saphenous vein and stab phlebectomies > 20 incisions  left leg  by Gretta Beganodd Early MD  . VASECTOMY  10/01/17    Family History  Problem Relation Age of Onset  . Colon cancer Father 2665       died at 9370 (following a hernia repair)  . Cancer Father   . Coronary artery disease Mother 2660       (has 3 stents)  . Heart disease Mother   . Cancer Paternal Grandfather        brain tumor  . Coronary artery disease Maternal Grandmother 60       smoker    No Known Allergies  Current Outpatient Prescriptions on File Prior to Visit  Medication Sig Dispense Refill  . escitalopram (LEXAPRO) 10 MG tablet 1/2 tablet by mouth once daily for 1 week, then increase to a full tablet once daily on week 30 tablet 0  . ibuprofen (ADVIL,MOTRIN) 200 MG tablet Take 200 mg by mouth as needed.     . Multiple Vitamins-Minerals (MULTIVITAMIN ADULT PO) Take by mouth.    Marland Kitchen. OVER THE COUNTER MEDICATION Weight Loss Supplement    . OVER THE COUNTER MEDICATION Creatine Supplement    . ranitidine (ZANTAC) 150 MG tablet Take 150 mg by mouth daily as needed.      No current facility-administered medications on file prior to visit.     BP 126/77   Pulse 73   Temp 98.5 F (36.9 C) (Oral)   Resp 16   Ht 5\' 11"  (1.803 m)   Wt 245 lb 12.8 oz (111.5 kg)   SpO2 99%   BMI 34.28 kg/m       Assessment & Plan:  Your mood is much better compared to last time. But as your wife pointed out you are just back from vacation and we need to watch you closely. If you do have any thought of harm to self or others would [facility]  Continue lexapro at 10 mg dose. Let us know in one week how your are(sooner if needed). Could advise increasing to 20 mg if needed.  I want you and wife to call  [facility] and establish care asap. Then call me or my chart me when  you have done so. Then will get staff to follow up with treatment center for quick appointment. Please also establish with  counselor.  Follow up here in 7-10 days in event you don't have psychiatrist appointment by then.  Note most of  interview done with 2 children in room with wife as well. Later they left and I got confirmation from patient that he does feel a lot better than before and he revealed no thoughts of harming self or others.   Weronika Birch, Ramon DredgeEdward, PA-C

## 2017-07-05 NOTE — Patient Instructions (Addendum)
Your mood is much better compared to last time. But as your wife pointed out you are just back from vacation and we need to watch you closely. If you do have any thought of harm to self or others would recommend [facility].  Continue lexapro at 10 mg dose. Let us know in one week how your are(sooner if needed). Could advise increasing to 20 mg if needed.  I want you and wife to call  [facility] center and establish care asap. Then call me or my chart me when  you have done so. Then will get staff to follow up with treatment center for quick appointment. Please also establish with counselor.  Follow up her in 7-10 days in event you don't have psychiatrist appointment by then.

## 2017-07-07 ENCOUNTER — Telehealth: Payer: Self-pay | Admitting: Medical

## 2017-07-07 NOTE — Telephone Encounter (Signed)
We are in process of making referral to [facility] but Victorino DikeJennifer called and stating pt had not contacted mood treatment center yet.   Will you call and talk with pt and see how he is? Would he call mood treatment center. I think if he does not then referral to psychiatry will be delayed.

## 2017-07-08 ENCOUNTER — Telehealth: Payer: Self-pay | Admitting: Medical

## 2017-07-08 NOTE — Telephone Encounter (Signed)
Called to follow up with patient. Pt states he's doing well. He has an appt with psychiatry today at 12pm.  He has not contacted the mood treatment center yet, but plans to do so.  During call, he requested that we cancel the appt with Melissa on 07/15/17.  Appt cancelled.

## 2017-07-08 NOTE — Telephone Encounter (Signed)
Since he has appointment with psychiatry it is ok for him to cancel follow up with Melissa. Just verify that he went on Monday and that proceeding forward they will continue to see him.

## 2017-07-11 NOTE — Telephone Encounter (Signed)
Called to follow up with patient.  Pt states he was able to keep his appt with psychiatry and they have agreed to see him going forward.  No additional questions or concerns voiced at this time.

## 2017-07-15 ENCOUNTER — Ambulatory Visit: Payer: 59 | Admitting: Family

## 2017-07-22 ENCOUNTER — Telehealth: Payer: Self-pay | Admitting: Family

## 2017-07-22 NOTE — Telephone Encounter (Signed)
Lexapro refill sent to pharmacy for 2 week supply only as pt is past due for a follow up. Please call pt to schedule appt as soon as possible. Thanks!

## 2017-07-25 NOTE — Telephone Encounter (Signed)
lvm for pt to call back to schedule

## 2017-07-26 NOTE — Telephone Encounter (Signed)
Please reach out to pt again. Thank you!

## 2017-07-27 NOTE — Telephone Encounter (Signed)
Left second detailed vm for pt to  Call back to schedule follow up with pcp

## 2017-08-05 ENCOUNTER — Encounter: Payer: Self-pay | Admitting: Family

## 2017-08-05 ENCOUNTER — Ambulatory Visit (INDEPENDENT_AMBULATORY_CARE_PROVIDER_SITE_OTHER): Payer: 59 | Admitting: Family

## 2017-08-05 VITALS — BP 117/74 | HR 78 | Temp 98.9°F | Resp 16 | Ht 71.0 in | Wt 242.0 lb

## 2017-08-05 DIAGNOSIS — F329 Major depressive disorder, single episode, unspecified: Secondary | ICD-10-CM

## 2017-08-05 DIAGNOSIS — F32A Depression, unspecified: Secondary | ICD-10-CM

## 2017-08-05 MED ORDER — ESCITALOPRAM OXALATE 10 MG PO TABS: ORAL_TABLET | ORAL | 1 refills | Status: DC

## 2017-08-05 NOTE — Progress Notes (Signed)
Subjective:    Patient ID: Angel Merritt, male    DOB: 1966-08-14, 51 y.o.   MRN: [id number]  HPI  Angel Merritt is a 51 yr old male who presents today for follow-up of anxiety and depression. Last  time I saw him we noted severe depression symptoms- pt scored 23 on the pH Q9. he was started on an SSRI. He saw Evern Core -on July 31 and noted some improvement in his mood. He was continued on Lexapro 10 mg. He is working on getting into the [facility]. He is having an issue with his insurance but hopes to get worked out the near future. He is established with a Social worker. He reports that he has met once with him and found it quite helpful. He reports stress that his wife is currently undergoing a midline crisis. He feels that they have not been able to work on their marriage because his wife has been worried about his mental health.   Review of Systems See HPI  Past Medical History:  Diagnosis Date  . ALKALINE PHOSPHATASE, ELEVATED 10/20/2009   Qualifier: Diagnosis of  By: Wynona Luna   . DEGENERATIVE JOINT DISEASE, KNEE 04/16/2010   Qualifier: Diagnosis of  By: Wynona Luna   . GERD 07/17/2009   Qualifier: Diagnosis of  By: Danelle Earthly CMA, Darlene    . HEADACHE 07/17/2009   Qualifier: Diagnosis of  By: Danelle Earthly CMA, Darlene    . HYPERLIPIDEMIA 10/16/2009   Qualifier: Diagnosis of  By: Wynona Luna   . HYPERTENSION 07/17/2009   Qualifier: Diagnosis of  By: Danelle Earthly CMA, Darlene    . RHINOSINUSITIS, ACUTE 10/16/2009   Qualifier: Diagnosis of  By: Wynona Luna   . Varicose veins   . VARICOSE VEINS, LOWER EXTREMITIES 04/16/2010   Qualifier: Diagnosis of  By: Wynona Luna      Social History   Social History  . Marital status: Married    Spouse name: N/A  . Number of children: N/A  . Years of education: N/A   Occupational History  . Not on file.   Social History Main Topics  . Smoking status: Never Smoker  . Smokeless tobacco: Never Used  .  Alcohol use 21.6 oz/week    36 Cans of beer per week  . Drug use: No  . Sexual activity: Not on file   Other Topics Concern  . Not on file   Social History Narrative   4 children- 65 (daughter), 2 (daughter), 8 son, 5 son   Works as Dealer   Married   Enjoys baseball, golf, gym    Past Surgical History:  Procedure Laterality Date  . ENDOVENOUS ABLATION SAPHENOUS VEIN W/ LASER Left 11-06-2015   endovenous laser ablation left greater saphenous vein and stab phlebectomies > 20 incisions  left leg  by Curt Jews MD  . VASECTOMY  10/01/17    Family History  Problem Relation Age of Onset  . Colon cancer Father 31       died at 92 (following a hernia repair)  . Cancer Father   . Coronary artery disease Mother 22       (has 3 stents)  . Heart disease Mother   . Cancer Paternal Grandfather        brain tumor  . Coronary artery disease Maternal Grandmother 60       smoker    No Known Allergies  Current Outpatient Prescriptions on File Prior  to Visit  Medication Sig Dispense Refill  . ibuprofen (ADVIL,MOTRIN) 200 MG tablet Take 200 mg by mouth as needed.     . Multiple Vitamins-Minerals (MULTIVITAMIN ADULT PO) Take by mouth.    . ranitidine (ZANTAC) 150 MG tablet Take 150 mg by mouth daily as needed.      No current facility-administered medications on file prior to visit.     BP 117/74 (BP Location: Left Arm, Cuff Size: Large)   Pulse 78   Temp 98.9 F (37.2 C) (Oral)   Resp 16   Ht 5' 11"  (1.803 m)   Wt 242 lb (109.8 kg)   SpO2 98%   BMI 33.75 kg/m              Objective:   Physical Exam  Constitutional: He is oriented to person, place, and time. He appears well-developed and well-nourished. No distress.  HENT:  Head: Normocephalic and atraumatic.  Musculoskeletal: He exhibits no edema.  Neurological: He is alert and oriented to person, place, and time.  Skin: Skin is warm and dry.  Psychiatric: He has a normal mood and affect. His behavior is  normal. Thought content normal.          Assessment & Plan:  Depression- Much improved. Initial pH Q9 was 23, score today on questioning her 7. He denies any side effects from the Lexapro. I encouraged him to continue current dose and follow up with his counselor. He is also trying to get him with psychiatry. He will schedule his physical at his convenience. 15 minutes spent with patient today couldn't 50% of this time was spent counseling the patient on his depression and treatment.

## 2017-08-05 NOTE — Patient Instructions (Signed)
Please schedule physical at the front desk.

## 2017-08-10 ENCOUNTER — Other Ambulatory Visit: Payer: Self-pay | Admitting: Family

## 2017-08-29 ENCOUNTER — Encounter: Payer: Self-pay | Admitting: Family

## 2017-08-29 ENCOUNTER — Ambulatory Visit (INDEPENDENT_AMBULATORY_CARE_PROVIDER_SITE_OTHER): Payer: 59 | Admitting: Family

## 2017-08-29 ENCOUNTER — Telehealth: Payer: Self-pay | Admitting: Family

## 2017-08-29 VITALS — BP 127/79 | HR 72 | Temp 98.5°F | Resp 18 | Ht 71.0 in | Wt 253.6 lb

## 2017-08-29 DIAGNOSIS — Z0000 Encounter for general adult medical examination without abnormal findings: Secondary | ICD-10-CM

## 2017-08-29 NOTE — Patient Instructions (Signed)
Please complete lab work prior to leaving.   

## 2017-08-29 NOTE — Telephone Encounter (Signed)
Could you please contact [facility] and see if they need any additional info from [place], pt states that they have not contacted him re: appointment yet. Thank you.

## 2017-08-29 NOTE — Progress Notes (Signed)
Subjective:    Patient ID: Angel Merritt, male    DOB: 24-Mar-1966, 51 y.o.   MRN: [id number]  HPI  Angel Merritt is a 51 year old male who presents today for complete physical.  Patient presents today for complete physical.  Immunizations: tdap 2015 Diet: diet is "good" stopped drinking previously Exercise: not exercising regularly Colonoscopy: due age 15 Vision: due Dental:  Up to date Wt Readings from Last 3 Encounters:  08/29/17 253 lb 9.6 oz (115 kg)  08/05/17 242 lb (109.8 kg)  07/05/17 245 lb 12.8 oz (111.5 kg)        Review of Systems  Constitutional: Positive for unexpected weight change.  HENT: Negative for hearing loss and rhinorrhea.   Eyes: Negative for visual disturbance.  Respiratory: Negative for cough.   Cardiovascular: Negative for leg swelling.  Gastrointestinal:       Reports occasionally diarrhea/constipation, diet related  Genitourinary: Negative for difficulty urinating, dysuria, frequency and hematuria.  Musculoskeletal: Negative for arthralgias and myalgias.  Skin: Negative for rash.  Neurological: Negative for headaches.  Hematological: Negative for adenopathy.  Psychiatric/Behavioral:       Reports stable mood Sleeping well   Past Medical History:  Diagnosis Date  . ALKALINE PHOSPHATASE, ELEVATED 10/20/2009   Qualifier: Diagnosis of  By: Nena Jordan   . DEGENERATIVE JOINT DISEASE, KNEE 04/16/2010   Qualifier: Diagnosis of  By: Nena Jordan   . Depression   . GERD 07/17/2009   Qualifier: Diagnosis of  By: Terrilee Croak CMA, Darlene    . HEADACHE 07/17/2009   Qualifier: Diagnosis of  By: Terrilee Croak CMA, Darlene    . HYPERLIPIDEMIA 10/16/2009   Qualifier: Diagnosis of  By: Nena Jordan   . HYPERTENSION 07/17/2009   Qualifier: Diagnosis of  By: Terrilee Croak CMA, Darlene    . RHINOSINUSITIS, ACUTE 10/16/2009   Qualifier: Diagnosis of  By: Nena Jordan   . Varicose veins   . VARICOSE VEINS, LOWER EXTREMITIES 04/16/2010   Qualifier:  Diagnosis of  By: Nena Jordan      Social History   Social History  . Marital status: Married    Spouse name: N/A  . Number of children: N/A  . Years of education: N/A   Occupational History  . Not on file.   Social History Main Topics  . Smoking status: Never Smoker  . Smokeless tobacco: Never Used  . Alcohol use 0.0 oz/week     Comment: 08/29/17-Pt states he hasn't had alcohol in 3-4 weeks  . Drug use: No  . Sexual activity: Not on file   Other Topics Concern  . Not on file   Social History Narrative   4 children- 50 (daughter), 33 (daughter), 8 son, 5 son   Works as Curator   Married   Enjoys baseball, golf, gym    Past Surgical History:  Procedure Laterality Date  . ENDOVENOUS ABLATION SAPHENOUS VEIN W/ LASER Left 11-06-2015   endovenous laser ablation left greater saphenous vein and stab phlebectomies > 20 incisions  left leg  by Gretta Began MD  . VASECTOMY  10/01/2016    Family History  Problem Relation Age of Onset  . Colon cancer Father 64       died at 75 (following a hernia repair)  . Cancer Father   . Coronary artery disease Mother 44       (has 3 stents)  . Heart disease Mother   . Cancer Paternal Grandfather  brain tumor  . Coronary artery disease Maternal Grandmother 60       smoker    No Known Allergies  Current Outpatient Prescriptions on File Prior to Visit  Medication Sig Dispense Refill  . escitalopram (LEXAPRO) 10 MG tablet Take 1 tablet (10 mg total) by mouth daily. 30 tablet 5  . ibuprofen (ADVIL,MOTRIN) 200 MG tablet Take 200 mg by mouth as needed.     . Multiple Vitamins-Minerals (MULTIVITAMIN ADULT PO) Take by mouth.    . ranitidine (ZANTAC) 150 MG tablet Take 150 mg by mouth daily as needed.      No current facility-administered medications on file prior to visit.     BP 127/79 (BP Location: Left Arm, Cuff Size: Large)   Pulse 72   Temp 98.5 F (36.9 C) (Oral)   Resp 18   Ht  (1.803 m)   Wt 253 lb 9.6 oz  (115 kg)   SpO2 99%   BMI 35.37 kg/m       Objective:   Physical Exam  Physical Exam  Constitutional: He is oriented to person, place, and time. He appears well-developed and well-nourished. No distress.  HENT:  Head: Normocephalic and atraumatic.  Right Ear: Tympanic membrane and ear canal normal.  Left Ear: Tympanic membrane and ear canal normal.  Mouth/Throat: Oropharynx is clear and moist.  Eyes: Pupils are equal, round, and reactive to light. No scleral icterus.  Neck: Normal range of motion. No thyromegaly present.  Cardiovascular: Normal rate and regular rhythm.   No murmur heard. Pulmonary/Chest: Effort normal and breath sounds normal. No respiratory distress. He has no wheezes. He has no rales. He exhibits no tenderness.  Abdominal: Soft. Bowel sounds are normal. He exhibits no distension and no mass. There is no tenderness. There is no rebound and no guarding.  Musculoskeletal: He exhibits no edema.  Lymphadenopathy:    He has no cervical adenopathy.  Neurological: He is alert and oriented to person, place, and time. He has normal patellar reflexes. He exhibits normal muscle tone. Coordination normal.  Skin: Skin is warm and dry.  Psychiatric: He has a normal mood and affect. His behavior is normal. Judgment and thought content normal.           Assessment & Plan:   Preventative care- discussed healthy diet, exercise and weight loss. Plan colo next year at 52.  Lab work up to date. Declines flu shot. Tetanus up to date. Advised pt to schedule dental visit.          Assessment & Plan:  Declines sleep study.

## 2017-09-01 NOTE — Telephone Encounter (Signed)
[facility] states [facility] reached out to patient with no return call, called patient states he has talked to them and will call them back

## 2017-11-22 ENCOUNTER — Ambulatory Visit: Payer: 59 | Admitting: Family

## 2017-11-25 ENCOUNTER — Encounter: Payer: Self-pay | Admitting: Family

## 2017-11-25 ENCOUNTER — Ambulatory Visit (INDEPENDENT_AMBULATORY_CARE_PROVIDER_SITE_OTHER): Payer: PRIVATE HEALTH INSURANCE | Admitting: Family

## 2017-11-25 VITALS — BP 124/83 | HR 80 | Temp 98.3°F | Resp 16 | Ht 71.0 in | Wt 260.0 lb

## 2017-11-25 DIAGNOSIS — F329 Major depressive disorder, single episode, unspecified: Secondary | ICD-10-CM

## 2017-11-25 DIAGNOSIS — F32A Depression, unspecified: Secondary | ICD-10-CM

## 2017-11-25 DIAGNOSIS — R202 Paresthesia of skin: Secondary | ICD-10-CM

## 2017-11-25 LAB — B12 AND FOLATE PANEL
Folate: 15.3 ng/mL
VITAMIN B 12: 301 pg/mL (ref 200–1100)

## 2017-11-25 NOTE — Patient Instructions (Signed)
Please start working on your diet and exercise. Follow up in 1 month- goal weight is 260 or less.   Complete lab work prior to leaving.

## 2017-11-25 NOTE — Progress Notes (Signed)
Subjective:    Patient ID: Angel AlbaRobert Merritt, male    DOB: 17-Oct-1966, 51 y.o.   MRN: [id number]  HPI  Angel Merritt is a 51 yr old male who presents today for follow up of depression. Reports that overall symptoms are stable.  Still having some stress at home, "good days and bad days."    Wt Readings from Last 3 Encounters:  11/25/17 260 lb (117.9 kg)  08/29/17 253 lb 9.6 oz (115 kg)  08/05/17 242 lb (109.8 kg)    Has some bilateral foot numbness at night, hands sometimes numb.  Denies back pain.   Review of Systems    see HPI  Past Medical History:  Diagnosis Date  . ALKALINE PHOSPHATASE, ELEVATED 10/20/2009   Qualifier: Diagnosis of  By: Nena JordanYoo DO, D. Kijuan   . DEGENERATIVE JOINT DISEASE, KNEE 04/16/2010   Qualifier: Diagnosis of  By: Nena JordanYoo DO, D. Tredarius   . Depression   . GERD 07/17/2009   Qualifier: Diagnosis of  By: Terrilee CroakKnight CMA, Darlene    . HEADACHE 07/17/2009   Qualifier: Diagnosis of  By: Terrilee CroakKnight CMA, Darlene    . HYPERLIPIDEMIA 10/16/2009   Qualifier: Diagnosis of  By: Nena JordanYoo DO, D. Paschal   . HYPERTENSION 07/17/2009   Qualifier: Diagnosis of  By: Terrilee CroakKnight CMA, Darlene    . RHINOSINUSITIS, ACUTE 10/16/2009   Qualifier: Diagnosis of  By: Nena JordanYoo DO, D. Juell   . Varicose veins   . VARICOSE VEINS, LOWER EXTREMITIES 04/16/2010   Qualifier: Diagnosis of  By: Nena JordanYoo DO, D. Taye      Social History   Socioeconomic History  . Marital status: Married    Spouse name: Not on file  . Number of children: Not on file  . Years of education: Not on file  . Highest education level: Not on file  Social Needs  . Financial resource strain: Not on file  . Food insecurity - worry: Not on file  . Food insecurity - inability: Not on file  . Transportation needs - medical: Not on file  . Transportation needs - non-medical: Not on file  Occupational History  . Not on file  Tobacco Use  . Smoking status: Never Smoker  . Smokeless tobacco: Never Used  Substance and Sexual Activity  . Alcohol  use: Yes    Alcohol/week: 0.0 oz    Comment: 08/29/17-Pt states he hasn't had alcohol in 3-4 weeks  . Drug use: No  . Sexual activity: Not on file  Other Topics Concern  . Not on file  Social History Narrative   4 children- 512 (daughter), 399 (daughter), 8 son, 5 son   Works as Curatormechanic   Married   Enjoys baseball, golf, gym    Past Surgical History:  Procedure Laterality Date  . ENDOVENOUS ABLATION SAPHENOUS VEIN W/ LASER Left 11-06-2015   endovenous laser ablation left greater saphenous vein and stab phlebectomies > 20 incisions  left leg  by Gretta Beganodd Early MD  . VASECTOMY  10/01/2016    Family History  Problem Relation Age of Onset  . Colon cancer Father 5065       died at 7870 (following a hernia repair)  . Cancer Father   . Coronary artery disease Mother 2360       (has 3 stents)  . Heart disease Mother   . Cancer Paternal Grandfather        brain tumor  . Coronary artery disease Maternal Grandmother 60       smoker  No Known Allergies  Current Outpatient Medications on File Prior to Visit  Medication Sig Dispense Refill  . escitalopram (LEXAPRO) 10 MG tablet Take 1 tablet (10 mg total) by mouth daily. 30 tablet 5  . ibuprofen (ADVIL,MOTRIN) 200 MG tablet Take 200 mg by mouth as needed.     . Multiple Vitamins-Minerals (MULTIVITAMIN ADULT PO) Take by mouth.    . ranitidine (ZANTAC) 150 MG tablet Take 150 mg by mouth daily as needed.      No current facility-administered medications on file prior to visit.     BP 124/83 (BP Location: Left Arm, Patient Position: Sitting, Cuff Size: Large)   Pulse 80   Temp 98.3 F (36.8 C) (Oral)   Resp 16   Ht 5\' 11"  (1.803 m)   Wt 260 lb (117.9 kg)   SpO2 96%   BMI 36.26 kg/m    Objective:   Physical Exam  Constitutional: He is oriented to person, place, and time. He appears well-developed and well-nourished. No distress.  HENT:  Head: Normocephalic and atraumatic.  Cardiovascular: Normal rate and regular rhythm.  No murmur  heard. Pulmonary/Chest: Effort normal and breath sounds normal. No respiratory distress. He has no wheezes. He has no rales.  Musculoskeletal: He exhibits no edema.  Neurological: He is alert and oriented to person, place, and time.  Neg tinels, neg phalans  Skin: Skin is warm and dry.  Psychiatric: He has a normal mood and affect. His behavior is normal. Thought content normal.          Assessment & Plan:  Paresthesia-  New, will Check b12 and folate.  Depression- stable but he has had significant weight gain. Reports he has not been exercising and has been eating poorly. Will have him work on [employer] diet, exercise and follow up in 1 month. If further weight gain will need to consider changing meds.  Pt agreeable to plan.

## 2017-11-26 ENCOUNTER — Encounter: Payer: Self-pay | Admitting: Family

## 2017-12-28 ENCOUNTER — Ambulatory Visit (INDEPENDENT_AMBULATORY_CARE_PROVIDER_SITE_OTHER): Payer: PRIVATE HEALTH INSURANCE | Admitting: Family

## 2017-12-28 ENCOUNTER — Encounter: Payer: Self-pay | Admitting: Family

## 2017-12-28 VITALS — BP 114/78 | HR 72 | Temp 98.1°F | Resp 16 | Ht 71.0 in | Wt 263.6 lb

## 2017-12-28 DIAGNOSIS — F329 Major depressive disorder, single episode, unspecified: Secondary | ICD-10-CM

## 2017-12-28 DIAGNOSIS — F32A Depression, unspecified: Secondary | ICD-10-CM

## 2017-12-28 MED ORDER — VENLAFAXINE HCL ER 37.5 MG PO CP24: ORAL_CAPSULE | ORAL | 1 refills | Status: DC

## 2017-12-28 MED ORDER — ESCITALOPRAM OXALATE 10 MG PO TABS
ORAL_TABLET | ORAL | 5 refills | Status: DC
Start: 2017-12-28 — End: 2018-02-08

## 2017-12-28 NOTE — Patient Instructions (Signed)
Start effexor, 1 tab once daily for 1 week, then increase to 2 tabs once daily on week two. Decrease lexapro to 1/2 tab once daily for 1 week, then every other day for 1 week then stop. Work on [employer] diet, exercise, weight loss.

## 2017-12-28 NOTE — Progress Notes (Signed)
Subjective:    Patient ID: Angel Merritt, male    DOB: 04-Jan-1966, [age] y.o.   MRN: [id number]  HPI   [name] is a 52 yr old male who presents today for follow up of his depression.  He is maintained on lexapro 10mg .  Reports mood is good. Has a new job which is less stressful and he is happy about that. His family is doing well. Reports diet and exercise "are terrible."    Wt Readings from Last 3 Encounters:  12/28/17 263 lb 9.6 oz (119.6 kg)  11/25/17 260 lb (117.9 kg)  08/29/17 253 lb 9.6 oz (115 kg)       Review of Systems See HPI  Past Medical History:  Diagnosis Date  . ALKALINE PHOSPHATASE, ELEVATED 10/20/2009   Qualifier: Diagnosis of  By: Nena JordanYoo DO, D. Deagen   . DEGENERATIVE JOINT DISEASE, KNEE 04/16/2010   Qualifier: Diagnosis of  By: Nena JordanYoo DO, D. Cohan   . Depression   . GERD 07/17/2009   Qualifier: Diagnosis of  By: Terrilee CroakKnight CMA, Darlene    . HEADACHE 07/17/2009   Qualifier: Diagnosis of  By: Terrilee CroakKnight CMA, Darlene    . HYPERLIPIDEMIA 10/16/2009   Qualifier: Diagnosis of  By: Nena JordanYoo DO, D. Bastion   . HYPERTENSION 07/17/2009   Qualifier: Diagnosis of  By: Terrilee CroakKnight CMA, Darlene    . RHINOSINUSITIS, ACUTE 10/16/2009   Qualifier: Diagnosis of  By: Nena JordanYoo DO, D. Nicholas   . Varicose veins   . VARICOSE VEINS, LOWER EXTREMITIES 04/16/2010   Qualifier: Diagnosis of  By: Nena JordanYoo DO, D. Kalijah      Social History   Socioeconomic History  . Marital status: Married    Spouse name: Not on file  . Number of children: Not on file  . Years of education: Not on file  . Highest education level: Not on file  Social Needs  . Financial resource strain: Not on file  . Food insecurity - worry: Not on file  . Food insecurity - inability: Not on file  . Transportation needs - medical: Not on file  . Transportation needs - non-medical: Not on file  Occupational History  . Not on file  Tobacco Use  . Smoking status: Never Smoker  . Smokeless tobacco: Never Used  Substance and Sexual Activity   . Alcohol use: Yes    Alcohol/week: 0.0 oz    Comment: 08/29/17-Pt states he hasn't had alcohol in 3-4 weeks  . Drug use: No  . Sexual activity: Not on file  Other Topics Concern  . Not on file  Social History Narrative   4 children- 2712 (daughter), 879 (daughter), 8 son, 5 son   Works as Curatormechanic   Married   Enjoys baseball, golf, gym    Past Surgical History:  Procedure Laterality Date  . ENDOVENOUS ABLATION SAPHENOUS VEIN W/ LASER Left 11-06-2015   endovenous laser ablation left greater saphenous vein and stab phlebectomies > 20 incisions  left leg  by Gretta Beganodd Early MD  . VASECTOMY  10/01/2016    Family History  Problem Relation Age of Onset  . Colon cancer Father 1265       died at 6770 (following a hernia repair)  . Cancer Father   . Coronary artery disease Mother 4460       (has 3 stents)  . Heart disease Mother   . Cancer Paternal Grandfather        brain tumor  . Coronary artery disease Maternal Grandmother 6160  smoker    No Known Allergies  Current Outpatient Medications on File Prior to Visit  Medication Sig Dispense Refill  . ibuprofen (ADVIL,MOTRIN) 200 MG tablet Take 200 mg by mouth as needed.     . Multiple Vitamins-Minerals (MULTIVITAMIN ADULT PO) Take by mouth.    . ranitidine (ZANTAC) 150 MG tablet Take 150 mg by mouth daily as needed.      No current facility-administered medications on file prior to visit.     BP 114/78 (BP Location: Left Arm, Cuff Size: Large)   Pulse 72   Temp 98.1 F (36.7 C) (Oral)   Resp 16   Ht 5\' 11"  (1.803 m)   Wt 263 lb 9.6 oz (119.6 kg)   SpO2 98%   BMI 36.76 kg/m       Objective:   Physical Exam  Constitutional: He is oriented to person, place, and time. He appears well-developed and well-nourished. No distress.  HENT:  Head: Normocephalic and atraumatic.  Cardiovascular: Normal rate and regular rhythm.  No murmur heard. Pulmonary/Chest: Effort normal and breath sounds normal. No respiratory distress. He has no  wheezes. He has no rales.  Musculoskeletal: He exhibits no edema.  Neurological: He is alert and oriented to person, place, and time.  Skin: Skin is warm and dry.  Psychiatric: He has a normal mood and affect. His behavior is normal. Thought content normal.          Assessment & Plan:  Depression- stable. However with continued weight gain I think we need to d/c ssri.  Advised pt as follows:  Start effexor, 1 tab once daily for 1 week, then increase to 2 tabs once daily on week two. Decrease lexapro to 1/2 tab once daily for 1 week, then every other day for 1 week then stop. Work on [employer], exercise, weight loss.

## 2018-01-02 ENCOUNTER — Telehealth: Payer: Self-pay | Admitting: Family

## 2018-01-02 MED ORDER — VENLAFAXINE HCL 37.5 MG PO TABS: ORAL_TABLET | ORAL | 0 refills | Status: DC

## 2018-01-02 NOTE — Telephone Encounter (Signed)
Copied from CRM [id number]. Topic: Quick Communication - See Telephone Encounter >> Jan 02, 2018 10:56 AM Jolayne Hainesaylor, Brittany L wrote: CRM for notification. See Telephone encounter for:   01/02/18.  Patient said that the venlafaxine XR (EFFEXOR XR) 37.5 MG 24 hr capsule was going to be $200 a month. He said he can not afford that & wants to know if he can just not take this and keep taking the lexapro.  Call back is [phone]

## 2018-01-02 NOTE — Telephone Encounter (Signed)
Lexapro is causing weight gain so I would like to try to get him off of lexapro. I resent rx for standard release effexor. Should be cheaper, let me know if cost is still a problem.

## 2018-01-02 NOTE — Telephone Encounter (Signed)
Spoke with patient regarding instructions. Patient verbalized understanding and will call if there is any further problems.

## 2018-01-31 ENCOUNTER — Other Ambulatory Visit: Payer: Self-pay | Admitting: Family

## 2018-02-08 ENCOUNTER — Ambulatory Visit (INDEPENDENT_AMBULATORY_CARE_PROVIDER_SITE_OTHER): Payer: PRIVATE HEALTH INSURANCE | Admitting: Family

## 2018-02-08 ENCOUNTER — Encounter: Payer: Self-pay | Admitting: Family

## 2018-02-08 VITALS — BP 132/83 | HR 74 | Temp 98.4°F | Resp 18 | Ht 71.0 in | Wt 281.6 lb

## 2018-02-08 DIAGNOSIS — F329 Major depressive disorder, single episode, unspecified: Secondary | ICD-10-CM

## 2018-02-08 DIAGNOSIS — R400 Somnolence: Secondary | ICD-10-CM

## 2018-02-08 DIAGNOSIS — R635 Abnormal weight gain: Secondary | ICD-10-CM

## 2018-02-08 DIAGNOSIS — F32A Depression, unspecified: Secondary | ICD-10-CM

## 2018-02-08 NOTE — Patient Instructions (Signed)
You should be contacted about scheduling your home sleep study. Do not drive if you are sleepy. Please work on healthy diet, exercise and weight loss.

## 2018-02-08 NOTE — Progress Notes (Signed)
Subjective:    P off of medications.  Atient ID: Angel Merritt, male    DOB: 09-Nov-1966, 52 y.o.   MRN: [id number]  HPI   Patient is a 52 year old male who presents today for follow-up of his depression.  Last visit he noted continued weight gain and we decided to taper him off of Lexapro.  We started him on Effexor.  He reports that he had increased appetite, daytime somnolence, and headache while on Effexor.  He also reports that he had several episodes where he fell asleep driving.  He reports that he slowly tapered himself off of Effexor and has been off of it for 1.5 weeks.  He reports that his mood is good.  Headache is resolved and that he has had no further sleepiness.   Wt Readings from Last 3 Encounters:  02/08/18 281 lb 9.6 oz (127.7 kg)  12/28/17 263 lb 9.6 oz (119.6 kg)  11/25/17 260 lb (117.9 kg)    Review of Systems    See HPI  Past Medical History:  Diagnosis Date  . ALKALINE PHOSPHATASE, ELEVATED 10/20/2009   Qualifier: Diagnosis of  By: Nena JordanYoo DO, D. Eathen   . DEGENERATIVE JOINT DISEASE, KNEE 04/16/2010   Qualifier: Diagnosis of  By: Nena JordanYoo DO, D. Rondarius   . Depression   . GERD 07/17/2009   Qualifier: Diagnosis of  By: Terrilee CroakKnight CMA, Darlene    . HEADACHE 07/17/2009   Qualifier: Diagnosis of  By: Terrilee CroakKnight CMA, Darlene    . HYPERLIPIDEMIA 10/16/2009   Qualifier: Diagnosis of  By: Nena JordanYoo DO, D. Trenten   . HYPERTENSION 07/17/2009   Qualifier: Diagnosis of  By: Terrilee CroakKnight CMA, Darlene    . RHINOSINUSITIS, ACUTE 10/16/2009   Qualifier: Diagnosis of  By: Nena JordanYoo DO, D. Summer   . Varicose veins   . VARICOSE VEINS, LOWER EXTREMITIES 04/16/2010   Qualifier: Diagnosis of  By: Nena JordanYoo DO, D. Jabier      Social History   Socioeconomic History  . Marital status: Married    Spouse name: Not on file  . Number of children: Not on file  . Years of education: Not on file  . Highest education level: Not on file  Social Needs  . Financial resource strain: Not on file  . Food insecurity -  worry: Not on file  . Food insecurity - inability: Not on file  . Transportation needs - medical: Not on file  . Transportation needs - non-medical: Not on file  Occupational History  . Not on file  Tobacco Use  . Smoking status: Never Smoker  . Smokeless tobacco: Never Used  Substance and Sexual Activity  . Alcohol use: Yes    Alcohol/week: 0.0 oz    Comment: 08/29/17-Pt states he hasn't had alcohol in 3-4 weeks  . Drug use: No  . Sexual activity: Not on file  Other Topics Concern  . 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 ENDOVENOUS ABLATION SAPHENOUS VEIN W/ LASER Left 11-06-2015   endovenous laser ablation left greater saphenous vein and stab phlebectomies > 20 incisions  left leg  by Gretta Beganodd Early MD  . VASECTOMY  10/01/2016    Family History  Problem Relation Age of Onset  . Colon cancer Father 6065       died at  70 (following a hernia repair)  . Cancer Father   . Coronary artery disease Mother 28       (has 3 stents)  . Heart disease Mother   . Cancer Paternal Grandfather        brain tumor  . Coronary artery disease Maternal Grandmother 60       smoker    Allergies  Allergen Reactions  . Effexor [Venlafaxine] Other (See Comments)    Severe daytime somnolence, head ache and weight gain  . Lexapro [Escitalopram Oxalate]     Weight gain    Current Outpatient Medications on File Prior to Visit  Medication Sig Dispense Refill  . ibuprofen (ADVIL,MOTRIN) 200 MG tablet Take 200 mg by mouth as needed.     . Multiple Vitamins-Minerals (MULTIVITAMIN ADULT PO) Take by mouth.    . ranitidine (ZANTAC) 150 MG tablet Take 150 mg by mouth daily as needed.      No current facility-administered medications on file prior to visit.     BP 132/83 (BP Location: Left Arm, Cuff Size: Large)   Pulse 74   Temp  98.4 F (36.9 C) (Oral)   Resp 18   Ht 5\' 11"  (1.803 m)   Wt 281 lb 9.6 oz (127.7 kg)   SpO2 98%   BMI 39.28 kg/m    Objective:   Physical Exam  Constitutional: He is oriented to person, place, and time. He appears well-developed and well-nourished. No distress.  HENT:  Head: Normocephalic and atraumatic.  Cardiovascular: Normal rate and regular rhythm.  No murmur heard. Pulmonary/Chest: Effort normal and breath sounds normal. No respiratory distress. He has no wheezes. He has no rales.  Musculoskeletal: He exhibits no edema.  Neurological: He is alert and oriented to person, place, and time.  Skin: Skin is warm and dry.  Psychiatric: He has a normal mood and affect. His behavior is normal. Thought content normal.          Assessment & Plan:  Depression-mood is good off of medications.  He scored 4 on PHQ 9.  We will monitor him off of medications.  Weight gain- we discussed healthy diet, exercise and weight loss.  Daytime somnolence-this reportedly happened only while on Effexor.  I am concerned about possible sleep apnea particularly in the setting of his recent weight gain.  Will order a home sleep study to rule out obstructive sleep apnea.  He is advised not to drive if sleepy.  Patient verbalizes understanding.

## 2018-03-08 ENCOUNTER — Telehealth: Payer: Self-pay | Admitting: *Deleted

## 2018-03-08 NOTE — Telephone Encounter (Signed)
Received fax from [facility] requesting refill of venlafaxine. Per last OV, pt was to remain off of venlafaxine and notify us if depression returns. Spoke with pt, he states he is not requesting to restart medication. Denial faxed back to pharmacy.

## 2018-03-08 NOTE — Telephone Encounter (Signed)
Synetta Failnita has already scheduled this pt and he no showed she called the # that was given and left message and nobody called her back Tobe SosSally E Ottinger

## 2018-03-08 NOTE — Telephone Encounter (Signed)
Libby-- Wayne LakesGwen said you might need this information below.

## 2018-03-08 NOTE — Telephone Encounter (Signed)
Attempted to contact pt to discuss sleep study and left detailed message on voicemail to call and let us know if he is or is not going to proceed with sleep study.

## 2018-03-08 NOTE — Telephone Encounter (Signed)
Gwen-- can you contact pulmonology to schedule a date / time for this then contact [employer] for authorization at below number?  [facility] [employer] [facility] date of scheduled sleep study. Study has not been scheduled yet and is pending with pulmonology for home sleep study. She states once they have a date of service they can move forward with the authorization. She requests that we call her back at [phone] and may leave message on her voicemail or we may call customer service at [phone] as she works part time.

## 2018-03-08 NOTE — Telephone Encounter (Signed)
Dr Brayton Cavesosullivan ordered this and her pcc has to do this precert before they give us the referral for the home study Tobe SosSally E Ottinger

## 2018-03-25 ENCOUNTER — Telehealth: Payer: Self-pay | Admitting: Family

## 2018-03-25 NOTE — Telephone Encounter (Signed)
Myrene GalasWalker, Anita S  Merritt, Cary Wilford, NP        Hello Angel Merritt   I had this patient scheduled to pick up HST machine on 03/03/2018 @ 4:30pm. He even ask me to text him on 3/28 to give him the address of our location which I did do. The patient didn't show up. LVM on 03/06/2018 and 03/10/2018 and no return call. I just got off the phone with the patient and he states he is feeling fine now and doesn't want to do the HST at this time   Thanks,  Synetta FailAnita

## 2018-03-25 NOTE — Telephone Encounter (Signed)
-----   Message from Lilian KapurAnita S Walker sent at 03/20/2018  3:29 PM EDT ----- Regarding: HST Hello Sandford CrazeMelissa O'Sullivan  I had this patient scheduled to pick up HST machine on 03/03/2018 @ 4:30pm. He even ask me to text him on 3/28 to give him the address of our location which I did do. The patient didn't show up. LVM on 03/06/2018 and 03/10/2018 and no return call. I just got off the phone with the patient and he states he is feeling fine now and doesn't want to do the HST at this time  Thanks, Synetta FailAnita

## 2018-05-12 ENCOUNTER — Ambulatory Visit: Payer: PRIVATE HEALTH INSURANCE | Admitting: Family

## 2018-05-19 ENCOUNTER — Ambulatory Visit: Payer: PRIVATE HEALTH INSURANCE | Admitting: Family

## 2018-09-01 ENCOUNTER — Encounter: Payer: Self-pay | Admitting: Family

## 2018-09-01 ENCOUNTER — Ambulatory Visit (INDEPENDENT_AMBULATORY_CARE_PROVIDER_SITE_OTHER): Payer: PRIVATE HEALTH INSURANCE | Admitting: Family

## 2018-09-01 VITALS — BP 124/81 | HR 74 | Temp 97.7°F | Resp 16 | Ht 71.0 in | Wt 293.0 lb

## 2018-09-01 DIAGNOSIS — Z0000 Encounter for general adult medical examination without abnormal findings: Secondary | ICD-10-CM | POA: Diagnosis not present

## 2018-09-01 DIAGNOSIS — Z125 Encounter for screening for malignant neoplasm of prostate: Secondary | ICD-10-CM | POA: Diagnosis not present

## 2018-09-01 LAB — URINALYSIS, ROUTINE W REFLEX MICROSCOPIC
BILIRUBIN URINE: NEGATIVE
HGB URINE DIPSTICK: NEGATIVE
Ketones, ur: NEGATIVE
LEUKOCYTES UA: NEGATIVE
NITRITE: NEGATIVE
RBC / HPF: NONE SEEN (ref 0–?)
Specific Gravity, Urine: 1.015 (ref 1.000–1.030)
TOTAL PROTEIN, URINE-UPE24: NEGATIVE
Urine Glucose: NEGATIVE
Urobilinogen, UA: 0.2 (ref 0.0–1.0)
WBC, UA: NONE SEEN (ref 0–?)
pH: 6.5 (ref 5.0–8.0)

## 2018-09-01 LAB — BASIC METABOLIC PANEL
BUN: 20 mg/dL (ref 6–23)
CALCIUM: 9.1 mg/dL (ref 8.4–10.5)
CO2: 28 meq/L (ref 19–32)
Chloride: 103 mEq/L (ref 96–112)
Creatinine, Ser: 0.93 mg/dL (ref 0.40–1.50)
GFR: 91.4 mL/min (ref >60.00)
GLUCOSE: 101 mg/dL — AB (ref 70–99)
Potassium: 4.4 mEq/L (ref 3.5–5.1)
SODIUM: 139 meq/L (ref 135–145)

## 2018-09-01 LAB — CBC WITH DIFFERENTIAL/PLATELET
Basophils Absolute: 0.1 10*3/uL (ref 0.0–0.1)
Basophils Relative: 1.1 % (ref 0.0–3.0)
EOS PCT: 4 % (ref 0.0–5.0)
Eosinophils Absolute: 0.3 10*3/uL (ref 0.0–0.7)
HCT: 42.4 % (ref 39.0–52.0)
Hemoglobin: 14.5 g/dL (ref 13.0–17.0)
LYMPHS ABS: 2.4 10*3/uL (ref 0.7–4.0)
Lymphocytes Relative: 36.4 % (ref 12.0–46.0)
MCHC: 34.3 g/dL (ref 30.0–36.0)
MCV: 87.1 fl (ref 78.0–100.0)
MONOS PCT: 11.9 % (ref 3.0–12.0)
Monocytes Absolute: 0.8 10*3/uL (ref 0.1–1.0)
NEUTROS ABS: 3.1 10*3/uL (ref 1.4–7.7)
NEUTROS PCT: 46.6 % (ref 43.0–77.0)
PLATELETS: 302 10*3/uL (ref 150.0–400.0)
RBC: 4.87 Mil/uL (ref 4.22–5.81)
RDW: 12.8 % (ref 11.5–15.5)
WBC: 6.6 10*3/uL (ref 4.0–10.5)

## 2018-09-01 LAB — PSA: PSA: 0.58 ng/mL (ref 0.10–4.00)

## 2018-09-01 LAB — TSH: TSH: 0.96 u[IU]/mL (ref 0.35–4.50)

## 2018-09-01 LAB — LIPID PANEL
CHOLESTEROL: 224 mg/dL — AB (ref 0–200)
HDL: 41.3 mg/dL (ref >39.00)
LDL Cholesterol: 145 mg/dL — ABNORMAL HIGH (ref 0–99)
NonHDL: 183.02
Total CHOL/HDL Ratio: 5
Triglycerides: 189 mg/dL — ABNORMAL HIGH (ref 0.0–149.0)
VLDL: 37.8 mg/dL (ref 0.0–40.0)

## 2018-09-01 LAB — HEPATIC FUNCTION PANEL
ALBUMIN: 4.2 g/dL (ref 3.5–5.2)
ALT: 21 U/L (ref 0–53)
AST: 12 U/L (ref 0–37)
Alkaline Phosphatase: 86 U/L (ref 39–117)
Bilirubin, Direct: 0.1 mg/dL (ref 0.0–0.3)
Total Bilirubin: 0.5 mg/dL (ref 0.2–1.2)
Total Protein: 6.5 g/dL (ref 6.0–8.3)

## 2018-09-01 NOTE — Progress Notes (Deleted)
ekg 

## 2018-09-01 NOTE — Progress Notes (Signed)
Subjective:    Patient ID: Angel Merritt, male    DOB: 07/28/1966, 52 y.o.   MRN: [id number]  HPI  Patient presents today for complete physical.  Immunizations: declines flu shot.  Tetanus 2015 Diet:poor Exercise: no  Colonoscopy: due Vision: last year Dental: up to date Wt Readings from Last 3 Encounters:  09/01/18 293 lb (132.9 kg)  02/08/18 281 lb 9.6 oz (127.7 kg)  12/28/17 263 lb 9.6 oz (119.6 kg)      Review of Systems  Constitutional: Positive for unexpected weight change. Negative for fatigue.       Did not complete sleep study, denies further issues with daytime somnolence.   HENT: Negative for hearing loss and rhinorrhea.   Eyes: Negative for visual disturbance.  Respiratory: Negative for cough and shortness of breath.   Cardiovascular: Negative for chest pain and leg swelling.  Gastrointestinal: Negative for blood in stool, constipation and diarrhea.  Genitourinary: Negative for dysuria, frequency and hematuria.  Musculoskeletal: Negative for arthralgias and myalgias.  Skin: Negative for rash.  Neurological: Negative for headaches.  Hematological: Negative for adenopathy.  Psychiatric/Behavioral:       Reports mood is good   Past Medical History:  Diagnosis Date  . ALKALINE PHOSPHATASE, ELEVATED 10/20/2009   Qualifier: Diagnosis of  By: Nena Jordan   . DEGENERATIVE JOINT DISEASE, KNEE 04/16/2010   Qualifier: Diagnosis of  By: Nena Jordan   . Depression   . GERD 07/17/2009   Qualifier: Diagnosis of  By: Terrilee Croak CMA, Darlene    . HEADACHE 07/17/2009   Qualifier: Diagnosis of  By: Terrilee Croak CMA, Darlene    . HYPERLIPIDEMIA 10/16/2009   Qualifier: Diagnosis of  By: Nena Jordan   . HYPERTENSION 07/17/2009   Qualifier: Diagnosis of  By: Terrilee Croak CMA, Darlene    . RHINOSINUSITIS, ACUTE 10/16/2009   Qualifier: Diagnosis of  By: Nena Jordan   . Varicose veins   . VARICOSE VEINS, LOWER EXTREMITIES 04/16/2010   Qualifier: Diagnosis of  By: Nena Jordan      Social History   Socioeconomic History  . Marital status: Married    Spouse name: Not on file  . Number of children: Not on file  . Years of education: Not on file  . Highest education level: Not on file  Occupational History  . Not on file  Social Needs  . Financial resource strain: Not on file  . Food insecurity:    Worry: Not on file    Inability: Not on file  . Transportation needs:    Medical: Not on file    Non-medical: Not on file  Tobacco Use  . Smoking status: Never Smoker  . Smokeless tobacco: Never Used  Substance and Sexual Activity  . Alcohol use: Yes    Alcohol/week: 0.0 standard drinks    Comment: 08/29/17-Pt states he hasn't had alcohol in 3-4 weeks  . Drug use: No  . Sexual activity: Not on file  Lifestyle  . Physical activity:    Days per week: Not on file    Minutes per session: Not on file  . Stress: Not on file  Relationships  . Social connections:    Talks on phone: Not on file    Gets together: Not on file    Attends religious service: Not on file    Active member of club or organization: Not on file    Attends meetings of clubs or organizations: Not on  file    Relationship status: Not on file  . Intimate partner violence:    Fear of current or ex partner: Not on file    Emotionally abused: Not on file    Physically abused: Not on file    Forced sexual activity: Not on file  Other Topics Concern  . Not on file  Social History Narrative   4 children- 25 (daughter), 72 (daughter), 8 son, 5 son   Works as Curator   Married   Enjoys baseball, golf, gym    Past Surgical History:  Procedure Laterality Date  . ENDOVENOUS ABLATION SAPHENOUS VEIN W/ LASER Left 11-06-2015   endovenous laser ablation left greater saphenous vein and stab phlebectomies > 20 incisions  left leg  by Gretta Began MD  . VASECTOMY  10/01/2016    Family History  Problem Relation Age of Onset  . Colon cancer Father 48       died at 69 (following a  hernia repair)  . Cancer Father   . Coronary artery disease Mother 60       (has 3 stents)  . Heart disease Mother   . Cancer Paternal Grandfather        brain tumor  . Coronary artery disease Maternal Grandmother 60       smoker    Allergies  Allergen Reactions  . Effexor [Venlafaxine] Other (See Comments)    Severe daytime somnolence, head ache and weight gain  . Lexapro [Escitalopram Oxalate]     Weight gain    Current Outpatient Medications on File Prior to Visit  Medication Sig Dispense Refill  . ibuprofen (ADVIL,MOTRIN) 200 MG tablet Take 200 mg by mouth as needed.     . Multiple Vitamins-Minerals (MULTIVITAMIN ADULT PO) Take by mouth.    . ranitidine (ZANTAC) 150 MG tablet Take 150 mg by mouth daily as needed.      No current facility-administered medications on file prior to visit.     BP 124/81 (BP Location: Left Arm, Patient Position: Sitting, Cuff Size: Large)   Pulse 74   Temp 97.7 F (36.5 C) (Oral)   Resp 16   Ht 5\' 11"  (1.803 m)   Wt 293 lb (132.9 kg)   SpO2 98%   BMI 40.87 kg/m       Objective:   Physical Exam  Physical Exam  Constitutional: He is oriented to person, place, and time. He appears well-developed and well-nourished. No distress.  HENT:  Head: Normocephalic and atraumatic.  Right Ear: Tympanic membrane and ear canal normal.  Left Ear: Tympanic membrane and ear canal normal.  Mouth/Throat: Oropharynx is clear and moist.  Eyes: Pupils are equal, round, and reactive to light. No scleral icterus.  Neck: Normal range of motion. No thyromegaly present.  Cardiovascular: Normal rate and regular rhythm.   No murmur heard. Pulmonary/Chest: Effort normal and breath sounds normal. No respiratory distress. He has no wheezes. He has no rales. He exhibits no tenderness.  Abdominal: Soft. Bowel sounds are normal. He exhibits no distension and no mass. There is no tenderness. There is no rebound and no guarding.  Musculoskeletal: He exhibits no  edema.  Lymphadenopathy:    He has no cervical adenopathy.  Neurological: He is alert and oriented to person, place, and time. He has normal patellar reflexes. He exhibits normal muscle tone. Coordination normal.  Skin: Skin is warm and dry.  Psychiatric: He has a normal mood and affect. His behavior is normal. Judgment and thought content normal.  Assessment & Plan:   Preventative Care- discussed healthy diet, exercise, weight loss.  Obtain routine lab work including PSA-Discussed pro's/cons. Refer for colonoscopy.  He notes a lot of personal stress.  + stress eating. Lots of soda, snacks on junk in his truck while driving and at night.  Advised d/c snacks/soda. Head back to the gym for exercise. Gave info on counseling to call to schedule an appointment.       Assessment & Plan:  EKG tracing is personally reviewed.  EKG notes NSR.  No acute changes.

## 2018-09-01 NOTE — Patient Instructions (Signed)
Please complete lab work prior to leaving. You should be contacted about your referral to GI for colonoscopy. Work hard on [employer], exercise and weight loss.

## 2018-11-22 ENCOUNTER — Encounter: Payer: Self-pay | Admitting: Family

## 2019-09-03 ENCOUNTER — Other Ambulatory Visit: Payer: Self-pay

## 2019-09-04 ENCOUNTER — Ambulatory Visit (INDEPENDENT_AMBULATORY_CARE_PROVIDER_SITE_OTHER): Payer: Managed Care, Other (non HMO) | Admitting: Family

## 2019-09-04 ENCOUNTER — Other Ambulatory Visit: Payer: Self-pay

## 2019-09-04 VITALS — BP 122/80 | HR 68 | Temp 97.6°F | Resp 18 | Ht 71.0 in | Wt 299.0 lb

## 2019-09-04 DIAGNOSIS — Z0000 Encounter for general adult medical examination without abnormal findings: Secondary | ICD-10-CM | POA: Diagnosis not present

## 2019-09-04 LAB — CBC WITH DIFFERENTIAL/PLATELET
Basophils Absolute: 0.1 10*3/uL (ref 0.0–0.1)
Basophils Relative: 1.1 % (ref 0.0–3.0)
Eosinophils Absolute: 0.3 10*3/uL (ref 0.0–0.7)
Eosinophils Relative: 4.7 % (ref 0.0–5.0)
HCT: 42.4 % (ref 39.0–52.0)
Hemoglobin: 14.4 g/dL (ref 13.0–17.0)
Lymphocytes Relative: 37.1 % (ref 12.0–46.0)
Lymphs Abs: 2.4 10*3/uL (ref 0.7–4.0)
MCHC: 33.8 g/dL (ref 30.0–36.0)
MCV: 89.2 fl (ref 78.0–100.0)
Monocytes Absolute: 0.8 10*3/uL (ref 0.1–1.0)
Monocytes Relative: 13.3 % — ABNORMAL HIGH (ref 3.0–12.0)
Neutro Abs: 2.8 10*3/uL (ref 1.4–7.7)
Neutrophils Relative %: 43.8 % (ref 43.0–77.0)
Platelets: 282 10*3/uL (ref 150.0–400.0)
RBC: 4.76 Mil/uL (ref 4.22–5.81)
RDW: 12.3 % (ref 11.5–15.5)
WBC: 6.3 10*3/uL (ref 4.0–10.5)

## 2019-09-04 LAB — LIPID PANEL
Cholesterol: 238 mg/dL — ABNORMAL HIGH (ref 0–200)
HDL: 44.3 mg/dL (ref >39.00)
LDL Cholesterol: 156 mg/dL — ABNORMAL HIGH (ref 0–99)
NonHDL: 193.43
Total CHOL/HDL Ratio: 5
Triglycerides: 185 mg/dL — ABNORMAL HIGH (ref 0.0–149.0)
VLDL: 37 mg/dL (ref 0.0–40.0)

## 2019-09-04 LAB — HEPATIC FUNCTION PANEL
ALT: 24 U/L (ref 0–53)
AST: 12 U/L (ref 0–37)
Albumin: 4.3 g/dL (ref 3.5–5.2)
Alkaline Phosphatase: 81 U/L (ref 39–117)
Bilirubin, Direct: 0.1 mg/dL (ref 0.0–0.3)
Total Bilirubin: 0.6 mg/dL (ref 0.2–1.2)
Total Protein: 6.5 g/dL (ref 6.0–8.3)

## 2019-09-04 LAB — BASIC METABOLIC PANEL
BUN: 22 mg/dL (ref 6–23)
CO2: 27 mEq/L (ref 19–32)
Calcium: 9.6 mg/dL (ref 8.4–10.5)
Chloride: 102 mEq/L (ref 96–112)
Creatinine, Ser: 0.91 mg/dL (ref 0.40–1.50)
GFR: 87.82 mL/min (ref >60.00)
Glucose, Bld: 104 mg/dL — ABNORMAL HIGH (ref 70–99)
Potassium: 4.2 mEq/L (ref 3.5–5.1)
Sodium: 138 mEq/L (ref 135–145)

## 2019-09-04 LAB — TSH: TSH: 1.67 u[IU]/mL (ref 0.35–4.50)

## 2019-09-04 MED ORDER — SILDENAFIL CITRATE 20 MG PO TABS: ORAL_TABLET | ORAL | 0 refills | Status: DC

## 2019-09-04 NOTE — Patient Instructions (Signed)
Please complete lab work prior to leaving. Work on eliminating sodas and fast food and getting 30 minutes of walking 5 days a week.

## 2019-09-04 NOTE — Progress Notes (Signed)
Subjective:    Patient ID: Angel Merritt, male    DOB: Aug 11, 1966, 53 y.o.   MRN: [id number]  HPI  Patient presents today for complete physical.  Immunizations: tdap 2015, declines flu shot  Diet: eating too much fast food, drinks 2 sodas a day.   Wt Readings from Last 3 Encounters:  09/04/19 299 lb (135.6 kg)  09/01/18 293 lb (132.9 kg)  02/08/18 281 lb 9.6 oz (127.7 kg)  Exercise: no exercise Colonoscopy:  screening Vision:  Last year Dental:  due  C/o- ED.    Review of Systems  Constitutional: Positive for unexpected weight change.  HENT: Negative for hearing loss and rhinorrhea.   Eyes: Negative for visual disturbance.  Respiratory: Negative for cough and shortness of breath.   Cardiovascular: Negative for chest pain and leg swelling.  Gastrointestinal: Positive for diarrhea. Negative for blood in stool and constipation.  Genitourinary: Negative for dysuria, frequency and hematuria.  Musculoskeletal: Negative for arthralgias and myalgias.  Skin: Negative for rash.  Neurological: Negative for headaches.  Hematological: Negative for adenopathy.  Psychiatric/Behavioral:       Denies depression/anxiety   Past Medical History:  Diagnosis Date  . ALKALINE PHOSPHATASE, ELEVATED 10/20/2009   Qualifier: Diagnosis of  By: Nena Jordan   . DEGENERATIVE JOINT DISEASE, KNEE 04/16/2010   Qualifier: Diagnosis of  By: Nena Jordan   . Depression   . GERD 07/17/2009   Qualifier: Diagnosis of  By: Terrilee Croak CMA, Darlene    . HEADACHE 07/17/2009   Qualifier: Diagnosis of  By: Terrilee Croak CMA, Darlene    . HYPERLIPIDEMIA 10/16/2009   Qualifier: Diagnosis of  By: Nena Jordan   . HYPERTENSION 07/17/2009   Qualifier: Diagnosis of  By: Terrilee Croak CMA, Darlene    . RHINOSINUSITIS, ACUTE 10/16/2009   Qualifier: Diagnosis of  By: Nena Jordan   . Varicose veins   . VARICOSE VEINS, LOWER EXTREMITIES 04/16/2010   Qualifier: Diagnosis of  By: Nena Jordan      Social  History   Socioeconomic History  . Marital status: Married    Spouse name: Not on file  . Number of children: Not on file  . Years of education: Not on file  . Highest education level: Not on file  Occupational History  . Not on file  Social Needs  . Financial resource strain: Not on file  . Food insecurity    Worry: Not on file    Inability: Not on file  . Transportation needs    Medical: Not on file    Non-medical: Not on file  Tobacco Use  . Smoking status: Never Smoker  . Smokeless tobacco: Never Used  Substance and Sexual Activity  . Alcohol use: Yes    Alcohol/week: 0.0 standard drinks    Comment: 08/29/17-Pt states he hasn't had alcohol in 3-4 weeks  . Drug use: No  . Sexual activity: Not on file  Lifestyle  . Physical activity    Days per week: Not on file    Minutes per session: Not on file  . Stress: Not on file  Relationships  . Social Musician on phone: Not on file    Gets together: Not on file    Attends religious service: Not on file    Active member of club or organization: Not on file    Attends meetings of clubs or organizations: Not on file    Relationship status: Not on  file  . Intimate partner violence    Fear of current or ex partner: Not on file    Emotionally abused: Not on file    Physically abused: Not on file    Forced sexual activity: Not on file  Other Topics Concern  . Not on file  Social History Narrative   4 children- 61 (daughter), 26 (daughter), 8 son, 5 son   Works as Dealer   Married   Enjoys baseball, golf, gym    Past Surgical History:  Procedure Laterality Date  . ENDOVENOUS ABLATION SAPHENOUS VEIN W/ LASER Left 11-06-2015   endovenous laser ablation left greater saphenous vein and stab phlebectomies > 20 incisions  left leg  by Curt Jews MD  . VASECTOMY  10/01/2016    Family History  Problem Relation Age of Onset  . Colon cancer Father 54       died at 79 (following a hernia repair)  . Cancer Father    . Coronary artery disease Mother 52       (has 3 stents)  . Heart disease Mother   . Cancer Paternal Grandfather        brain tumor  . Coronary artery disease Maternal Grandmother 60       smoker    Allergies  Allergen Reactions  . Effexor [Venlafaxine] Other (See Comments)    Severe daytime somnolence, head ache and weight gain  . Lexapro [Escitalopram Oxalate]     Weight gain    Current Outpatient Medications on File Prior to Visit  Medication Sig Dispense Refill  . ibuprofen (ADVIL,MOTRIN) 200 MG tablet Take 200 mg by mouth as needed.     . Multiple Vitamins-Minerals (MULTIVITAMIN ADULT PO) Take by mouth.    . ranitidine (ZANTAC) 150 MG tablet Take 150 mg by mouth daily as needed.      No current facility-administered medications on file prior to visit.     BP 122/80 (BP Location: Right Arm, Patient Position: Sitting, Cuff Size: Large)   Pulse 68   Temp 97.6 F (36.4 C) (Temporal)   Resp 18   Ht 5\' 11"  (1.803 m)   Wt 299 lb (135.6 kg)   SpO2 97%   BMI 41.70 kg/m       Objective:   Physical Exam  Physical Exam  Constitutional: He is oriented to person, place, and time. He appears obese and well-nourished. No distress.  HENT:  Head: Normocephalic and atraumatic.  Right Ear: Tympanic membrane and ear canal normal.  Left Ear: Tympanic membrane and ear canal normal.  Mouth/Throat: not examined- pt wearing mask Eyes: Pupils are equal, round, and reactive to light. No scleral icterus.  Neck: Normal range of motion. No thyromegaly present.  Cardiovascular: Normal rate and regular rhythm.   No murmur heard. Pulmonary/Chest: Effort normal and breath sounds normal. No respiratory distress. He has no wheezes. He has no rales. He exhibits no tenderness.  Abdominal: Soft. Bowel sounds are normal. He exhibits no distension and no mass. He has a small easily reducible umbilical hernia.There is no tenderness. There is no rebound and no guarding.  Musculoskeletal: He exhibits  no edema.  Lymphadenopathy:    He has no cervical adenopathy.  Neurological: He is alert and oriented to person, place, and time. He has normal patellar reflexes. He exhibits normal muscle tone. Coordination normal.  Skin: Skin is warm and dry.  Psychiatric: He has a normal mood and affect. His behavior is normal. Judgment and thought content normal.  Assessment & Plan:   Preventative care- discussed healthy diet, exercise and weight loss. Obtain routine lab work. Refer for colonoscopy.  Tdap up to date. Declines flu shot.        Assessment & Plan:

## 2019-09-05 ENCOUNTER — Encounter: Payer: Self-pay | Admitting: Family

## 2019-09-05 NOTE — Progress Notes (Signed)
Mailed out to pt 

## 2019-10-08 ENCOUNTER — Encounter: Payer: Self-pay | Admitting: Family

## 2020-09-05 ENCOUNTER — Ambulatory Visit (INDEPENDENT_AMBULATORY_CARE_PROVIDER_SITE_OTHER): Payer: Managed Care, Other (non HMO) | Admitting: Family

## 2020-09-05 ENCOUNTER — Other Ambulatory Visit: Payer: Self-pay

## 2020-09-05 ENCOUNTER — Encounter: Payer: Self-pay | Admitting: Family

## 2020-09-05 VITALS — BP 132/86 | HR 71 | Temp 98.7°F | Resp 16 | Ht 71.0 in | Wt 313.0 lb

## 2020-09-05 DIAGNOSIS — I83029 Varicose veins of left lower extremity with ulcer of unspecified site: Secondary | ICD-10-CM

## 2020-09-05 DIAGNOSIS — Z23 Encounter for immunization: Secondary | ICD-10-CM

## 2020-09-05 DIAGNOSIS — L97929 Non-pressure chronic ulcer of unspecified part of left lower leg with unspecified severity: Secondary | ICD-10-CM | POA: Diagnosis not present

## 2020-09-05 DIAGNOSIS — R200 Anesthesia of skin: Secondary | ICD-10-CM

## 2020-09-05 DIAGNOSIS — Z0000 Encounter for general adult medical examination without abnormal findings: Secondary | ICD-10-CM

## 2020-09-05 DIAGNOSIS — L309 Dermatitis, unspecified: Secondary | ICD-10-CM | POA: Diagnosis not present

## 2020-09-05 DIAGNOSIS — K429 Umbilical hernia without obstruction or gangrene: Secondary | ICD-10-CM | POA: Diagnosis not present

## 2020-09-05 DIAGNOSIS — N529 Male erectile dysfunction, unspecified: Secondary | ICD-10-CM

## 2020-09-05 DIAGNOSIS — Z1159 Encounter for screening for other viral diseases: Secondary | ICD-10-CM

## 2020-09-05 DIAGNOSIS — E1169 Type 2 diabetes mellitus with other specified complication: Secondary | ICD-10-CM

## 2020-09-05 DIAGNOSIS — R635 Abnormal weight gain: Secondary | ICD-10-CM

## 2020-09-05 DIAGNOSIS — E785 Hyperlipidemia, unspecified: Secondary | ICD-10-CM

## 2020-09-05 MED ORDER — BETAMETHASONE VALERATE 0.1 % EX OINT: 1.0000 "application " | TOPICAL_OINTMENT | Freq: Two times a day (BID) | CUTANEOUS | 0 refills | Status: DC

## 2020-09-05 NOTE — Progress Notes (Signed)
Subjective:    Patient ID: Angel Merritt, male    DOB: [birth year], 54 y.o.   MRN: [id number]  HPI Patient presents today for complete physical.  Immunizations: tdap 2015, had pfizer #1 last week  Wt Readings from Last 3 Encounters:  09/05/20 (!) 313 lb (142 kg)  09/04/19 299 lb (135.6 kg)  09/01/18 293 lb (132.9 kg)  diet: reports poor diet. Has had a lot of stress  2 sausage and egg biscuits/hardy's, coffee 5 pieces sliced ham and small bag of chips, diet soda starcrunch cake and pretzels Steak and cheese hoagie french fries Diet soda Exercise: not exercising Colonoscopy: due Vision: up to date Dental: scheduled  Reports some numbness across the left toes  C/o rash inside left knee  ED- reports good response to revatio  Review of Systems  Constitutional: Negative for unexpected weight change.  HENT: Negative for hearing loss and rhinorrhea.   Eyes: Negative for visual disturbance.  Respiratory: Negative for cough and shortness of breath.   Cardiovascular: Negative for chest pain.  Gastrointestinal: Negative for constipation and diarrhea.  Genitourinary: Negative for dysuria and frequency.  Musculoskeletal: Positive for arthralgias (left knee pain only when kneeling).  Skin: Positive for rash (inside the left knee).  Neurological: Negative for headaches.  Hematological: Negative for adenopathy.  Psychiatric/Behavioral:       Denies depression/anxiety   Past Medical History:  Diagnosis Date  . ALKALINE PHOSPHATASE, ELEVATED 10/20/2009   Qualifier: Diagnosis of  By: Nena Jordan   . DEGENERATIVE JOINT DISEASE, KNEE 04/16/2010   Qualifier: Diagnosis of  By: Nena Jordan   . Depression   . GERD 07/17/2009   Qualifier: Diagnosis of  By: Terrilee Croak CMA, Darlene    . HEADACHE 07/17/2009   Qualifier: Diagnosis of  By: Terrilee Croak CMA, Darlene    . HYPERLIPIDEMIA 10/16/2009   Qualifier: Diagnosis of  By: Nena Jordan   . HYPERTENSION 07/17/2009   Qualifier:  Diagnosis of  By: Terrilee Croak CMA, Darlene    . RHINOSINUSITIS, ACUTE 10/16/2009   Qualifier: Diagnosis of  By: Nena Jordan   . Varicose veins   . VARICOSE VEINS, LOWER EXTREMITIES 04/16/2010   Qualifier: Diagnosis of  By: Nena Jordan      Social History   Socioeconomic History  . Marital status: Married    Spouse name: Not on file  . Number of children: Not on file  . Years of education: Not on file  . Highest education level: Not on file  Occupational History  . Not on file  Tobacco Use  . Smoking status: Never Smoker  . Smokeless tobacco: Never Used  Substance and Sexual Activity  . Alcohol use: Yes    Alcohol/week: 0.0 standard drinks    Comment: 08/29/17-Pt states he hasn't had alcohol in 3-4 weeks  . Drug use: No  . Sexual activity: Not on file  Other Topics Concern  . Not on file  Social History Narrative   4 children- 16 (daughter), 52 (daughter), 8 son, 5 son   Works as Curator   Married   Enjoys baseball, golf, gym   Social Determinants of Corporate investment banker Strain:   . Difficulty of Paying Living Expenses: Not on file  Food Insecurity:   . Worried About Programme researcher, broadcasting/film/video in the Last Year: Not on file  . Ran Out of Food in the Last Year: Not on file  Transportation Needs:   . Lack  of Transportation (Medical): Not on file  . Lack of Transportation (Non-Medical): Not on file  Physical Activity:   . Days of Exercise per Week: Not on file  . Minutes of Exercise per Session: Not on file  Stress:   . Feeling of Stress : Not on file  Social Connections:   . Frequency of Communication with Friends and Family: Not on file  . Frequency of Social Gatherings with Friends and Family: Not on file  . Attends Religious Services: Not on file  . Active Member of Clubs or Organizations: Not on file  . Attends Banker Meetings: Not on file  . Marital Status: Not on file  Intimate Partner Violence:   . Fear of Current or Ex-Partner: Not on  file  . Emotionally Abused: Not on file  . Physically Abused: Not on file  . Sexually Abused: Not on file    Past Surgical History:  Procedure Laterality Date  . ENDOVENOUS ABLATION SAPHENOUS VEIN W/ LASER Left 11-06-2015   endovenous laser ablation left greater saphenous vein and stab phlebectomies > 20 incisions  left leg  by Gretta Began MD  . VASECTOMY  10/01/2016    Family History  Problem Relation Age of Onset  . Colon cancer Father 69       died at 80 (following a hernia repair)  . Cancer Father   . Coronary artery disease Mother 29       (has 3 stents)  . Heart disease Mother   . Cancer Paternal Grandfather        brain tumor  . Coronary artery disease Maternal Grandmother 60       smoker    Allergies  Allergen Reactions  . Effexor [Venlafaxine] Other (See Comments)    Severe daytime somnolence, head ache and weight gain  . Lexapro [Escitalopram Oxalate]     Weight gain    Current Outpatient Medications on File Prior to Visit  Medication Sig Dispense Refill  . ibuprofen (ADVIL,MOTRIN) 200 MG tablet Take 200 mg by mouth as needed.     . Multiple Vitamins-Minerals (MULTIVITAMIN ADULT PO) Take by mouth.    . ranitidine (ZANTAC) 150 MG tablet Take 150 mg by mouth daily as needed.     . sildenafil (REVATIO) 20 MG tablet Take 1-2 tablets by mouth prior to sexual activity 50 tablet 0   No current facility-administered medications on file prior to visit.    BP 132/86 (BP Location: Left Arm, Patient Position: Sitting, Cuff Size: Large)   Pulse 71   Temp 98.7 F (37.1 C) (Oral)   Resp 16   Ht 5\' 11"  (1.803 m)   Wt (!) 313 lb (142 kg)   SpO2 98%   BMI 43.65 kg/m        Objective:   Physical Exam Constitutional:      General: He is not in acute distress.    Appearance: He is well-developed.  HENT:     Head: Normocephalic and atraumatic.     Right Ear: Tympanic membrane and ear canal normal.     Left Ear: Tympanic membrane and ear canal normal.  Eyes:      Extraocular Movements: Extraocular movements intact.     Pupils: Pupils are equal, round, and reactive to light.  Cardiovascular:     Rate and Rhythm: Normal rate and regular rhythm.     Heart sounds: No murmur heard.   Pulmonary:     Effort: Pulmonary effort is normal. No respiratory distress.  Breath sounds: Normal breath sounds. No wheezing or rales.  Abdominal:     Comments: + umbilical hernia noted  Skin:    General: Skin is warm and dry.  Neurological:     Mental Status: He is alert and oriented to person, place, and time.     Comments: Bilateral UE/LE strength is 5/5  Psychiatric:        Behavior: Behavior normal.        Thought Content: Thought content normal.           Assessment & Plan:  Preventative care- discussed dietary changes to help improve his nutrition and help him with weight loss. Obtain PSA, Hep C.  Declines flu shot.  Had [employer] #1.   Shingrix #1 today. Agreeable to colonoscopy.    Ventral hernia- would like to see surgeon for this.    ED- stable on prn revatio- continue same.   Eczema- new. rx betamethasone prn.   Toe numbness (left toes only)- check b12 and folate.  Could be radicular numbness, but he denies any back pain or weakness.  Will monitor.   This visit occurred during the SARS-CoV-2 public health emergency.  Safety protocols were in place, including screening questions prior to the visit, additional usage of staff PPE, and extensive cleaning of exam room while observing appropriate contact time as indicated for disinfecting solutions.   This visit occurred during the SARS-CoV-2 public health emergency.  Safety protocols were in place, including screening questions prior to the visit, additional usage of staff PPE, and extensive cleaning of exam room while observing appropriate contact time as indicated for disinfecting solutions.

## 2020-09-05 NOTE — Patient Instructions (Addendum)
Please complete lab work prior to leaving. Please work on increasing healthy diet, exercise and weight loss.  You can apply the betamethasone cream to the rash on your leg as needed.    Preventive Care 59-52 Years Old, Male Preventive care refers to lifestyle choices and visits with your health care provider that can promote health and wellness. This includes:  A yearly physical exam. This is also called an annual well check.  Regular dental and eye exams.  Immunizations.  Screening for certain conditions.  Healthy lifestyle choices, such as eating a healthy diet, getting regular exercise, not using drugs or products that contain nicotine and tobacco, and limiting alcohol use. What can I expect for my preventive care visit? Physical exam Your health care provider will check:  Height and weight. These may be used to calculate body mass index (BMI), which is a measurement that tells if you are at a healthy weight.  Heart rate and blood pressure.  Your skin for abnormal spots. Counseling Your health care provider may ask you questions about:  Alcohol, tobacco, and drug use.  Emotional well-being.  Home and relationship well-being.  Sexual activity.  Eating habits.  Work and work Statistician. What immunizations do I need?  Influenza (flu) vaccine  This is recommended every year. Tetanus, diphtheria, and pertussis (Tdap) vaccine  You may need a Td booster every 10 years. Varicella (chickenpox) vaccine  You may need this vaccine if you have not already been vaccinated. Zoster (shingles) vaccine  You may need this after age 56. Measles, mumps, and rubella (MMR) vaccine  You may need at least one dose of MMR if you were born in 1957 or later. You may also need a second dose. Pneumococcal conjugate (PCV13) vaccine  You may need this if you have certain conditions and were not previously vaccinated. Pneumococcal polysaccharide (PPSV23) vaccine  You may need one or  two doses if you smoke cigarettes or if you have certain conditions. Meningococcal conjugate (MenACWY) vaccine  You may need this if you have certain conditions. Hepatitis A vaccine  You may need this if you have certain conditions or if you travel or work in places where you may be exposed to hepatitis A. Hepatitis B vaccine  You may need this if you have certain conditions or if you travel or work in places where you may be exposed to hepatitis B. Haemophilus influenzae type b (Hib) vaccine  You may need this if you have certain risk factors. Human papillomavirus (HPV) vaccine  If recommended by your health care provider, you may need three doses over 6 months. You may receive vaccines as individual doses or as more than one vaccine together in one shot (combination vaccines). Talk with your health care provider about the risks and benefits of combination vaccines. What tests do I need? Blood tests  Lipid and cholesterol levels. These may be checked every 5 years, or more frequently if you are over 52 years old.  Hepatitis C test.  Hepatitis B test. Screening  Lung cancer screening. You may have this screening every year starting at age 53 if you have a 30-pack-year history of smoking and currently smoke or have quit within the past 15 years.  Prostate cancer screening. Recommendations will vary depending on your family history and other risks.  Colorectal cancer screening. All adults should have this screening starting at age 32 and continuing until age 56. Your health care provider may recommend screening at age 3 if you are at increased  risk. You will have tests every 1-10 years, depending on your results and the type of screening test.  Diabetes screening. This is done by checking your blood sugar (glucose) after you have not eaten for a while (fasting). You may have this done every 1-3 years.  Sexually transmitted disease (STD) testing. Follow these instructions at  home: Eating and drinking  Eat a diet that includes fresh fruits and vegetables, whole grains, lean protein, and low-fat dairy products.  Take vitamin and mineral supplements as recommended by your health care provider.  Do not drink alcohol if your health care provider tells you not to drink.  If you drink alcohol: ? Limit how much you have to 0-2 drinks a day. ? Be aware of how much alcohol is in your drink. In the [place] one drink equals one 12 oz bottle of beer (355 mL), one 5 oz glass of wine (148 mL), or one 1 oz glass of hard liquor (44 mL). Lifestyle  Take daily care of your teeth and gums.  Stay active. Exercise for at least 30 minutes on 5 or more days each week.  Do not use any products that contain nicotine or tobacco, such as cigarettes, e-cigarettes, and chewing tobacco. If you need help quitting, ask your health care provider.  If you are sexually active, practice safe sex. Use a condom or other form of protection to prevent STIs (sexually transmitted infections).  Talk with your health care provider about taking a low-dose aspirin every day starting at age 40. What's next?  Go to your health care provider once a year for a well check visit.  Ask your health care provider how often you should have your eyes and teeth checked.  Stay up to date on all vaccines. This information is not intended to replace advice given to you by your health care provider. Make sure you discuss any questions you have with your health care provider. Document Revised: 11/16/2018 Document Reviewed: 11/16/2018 Elsevier Patient Education  2020 [employer].

## 2020-09-07 ENCOUNTER — Encounter: Payer: Self-pay | Admitting: Family

## 2020-09-07 ENCOUNTER — Telehealth: Payer: Self-pay | Admitting: Family

## 2020-09-07 DIAGNOSIS — E538 Deficiency of other specified B group vitamins: Secondary | ICD-10-CM | POA: Insufficient documentation

## 2020-09-07 HISTORY — DX: Deficiency of other specified B group vitamins: E53.8

## 2020-09-07 MED ORDER — VITAMIN B-12 1000 MCG PO TABS: 1000.0000 ug | ORAL_TABLET | Freq: Every day | ORAL | Status: DC

## 2020-09-07 NOTE — Telephone Encounter (Signed)
Please advise pt that his b12 level is on the low side.  Since he has been having tingling in his feet, I would like him to add oral b12 PO once daily and repeat b12 level in 3 months.    Sugar is very mildly elevated. Cholesterol is elevated. Please continue to work on the dietary changes we discussed at his visit as well as exercise.

## 2020-09-08 LAB — COMPREHENSIVE METABOLIC PANEL
AG Ratio: 1.9 (calc) (ref 1.0–2.5)
ALT: 21 U/L (ref 9–46)
AST: 14 U/L (ref 10–35)
Albumin: 4.2 g/dL (ref 3.6–5.1)
Alkaline phosphatase (APISO): 88 U/L (ref 35–144)
BUN: 16 mg/dL (ref 7–25)
CO2: 23 mmol/L (ref 20–32)
Calcium: 9 mg/dL (ref 8.6–10.3)
Chloride: 103 mmol/L (ref 98–110)
Creat: 0.93 mg/dL (ref 0.70–1.33)
Globulin: 2.2 g/dL (calc) (ref 1.9–3.7)
Glucose, Bld: 102 mg/dL — ABNORMAL HIGH (ref 65–99)
Potassium: 4.4 mmol/L (ref 3.5–5.3)
Sodium: 139 mmol/L (ref 135–146)
Total Bilirubin: 0.4 mg/dL (ref 0.2–1.2)
Total Protein: 6.4 g/dL (ref 6.1–8.1)

## 2020-09-08 LAB — LIPID PANEL
Cholesterol: 207 mg/dL — ABNORMAL HIGH (ref <200)
HDL: 42 mg/dL (ref >=40)
LDL Cholesterol (Calc): 134 mg/dL (calc) — ABNORMAL HIGH
Non-HDL Cholesterol (Calc): 165 mg/dL (calc) — ABNORMAL HIGH (ref <130)
Total CHOL/HDL Ratio: 4.9 (calc) (ref <5.0)
Triglycerides: 177 mg/dL — ABNORMAL HIGH (ref <150)

## 2020-09-08 LAB — PSA: PSA: 0.65 ng/mL (ref <=4.0)

## 2020-09-08 LAB — HEPATITIS C ANTIBODY
Hepatitis C Ab: NONREACTIVE
SIGNAL TO CUT-OFF: 0.01 (ref <1.00)

## 2020-09-08 LAB — B12 AND FOLATE PANEL
Folate: 12.4 ng/mL
Vitamin B-12: 237 pg/mL (ref 200–1100)

## 2020-09-08 LAB — TSH: TSH: 1.11 mIU/L (ref 0.40–4.50)

## 2020-09-08 LAB — EXTRA LAV TOP TUBE

## 2020-09-08 NOTE — Telephone Encounter (Signed)
Lvm for patient to call about his results.

## 2020-09-12 NOTE — Telephone Encounter (Signed)
Results, provider's comments and advised given to patient over the phone. He verbalized understanding and will start otc B12. He was scheduled for repeat B12 on 12-15-2020

## 2020-09-26 ENCOUNTER — Encounter: Payer: Self-pay | Admitting: Gastroenterology

## 2020-10-22 ENCOUNTER — Other Ambulatory Visit: Payer: Self-pay

## 2020-10-22 ENCOUNTER — Encounter: Payer: Self-pay | Admitting: Gastroenterology

## 2020-10-22 ENCOUNTER — Ambulatory Visit (AMBULATORY_SURGERY_CENTER): Payer: Self-pay | Admitting: *Deleted

## 2020-10-22 VITALS — Ht 71.0 in | Wt 307.0 lb

## 2020-10-22 DIAGNOSIS — Z1211 Encounter for screening for malignant neoplasm of colon: Secondary | ICD-10-CM

## 2020-10-22 MED ORDER — SUTAB 1479-225-188 MG PO TABS: 1.0000 | ORAL_TABLET | Freq: Once | ORAL | 0 refills | Status: AC

## 2020-10-22 NOTE — Progress Notes (Signed)

## 2020-11-06 ENCOUNTER — Other Ambulatory Visit: Payer: Self-pay

## 2020-11-06 ENCOUNTER — Ambulatory Visit (INDEPENDENT_AMBULATORY_CARE_PROVIDER_SITE_OTHER): Payer: Managed Care, Other (non HMO)

## 2020-11-06 DIAGNOSIS — Z23 Encounter for immunization: Secondary | ICD-10-CM | POA: Diagnosis not present

## 2020-11-06 NOTE — Progress Notes (Signed)
Pt is here today for shingrix vaccine. Pt was given shingrix vaccine in right deltoid. Pt tolerated well/ 

## 2020-11-07 ENCOUNTER — Encounter: Payer: Self-pay | Admitting: Gastroenterology

## 2020-11-07 ENCOUNTER — Ambulatory Visit (AMBULATORY_SURGERY_CENTER): Payer: Managed Care, Other (non HMO) | Admitting: Gastroenterology

## 2020-11-07 VITALS — BP 117/74 | HR 70 | Temp 98.0°F | Resp 18 | Ht 71.0 in | Wt 307.0 lb

## 2020-11-07 DIAGNOSIS — Z1211 Encounter for screening for malignant neoplasm of colon: Secondary | ICD-10-CM | POA: Diagnosis not present

## 2020-11-07 DIAGNOSIS — Z800 Family history of malignant neoplasm of digestive organs: Secondary | ICD-10-CM | POA: Diagnosis not present

## 2020-11-07 MED ORDER — SODIUM CHLORIDE 0.9 % IV SOLN: 500.0000 mL | Freq: Once | INTRAVENOUS | Status: DC

## 2020-11-07 NOTE — Progress Notes (Signed)
Pt's states no medical or surgical changes since previsit or office visit. 

## 2020-11-07 NOTE — Progress Notes (Signed)
No problems noted in the recovery room. maw 

## 2020-11-07 NOTE — Patient Instructions (Signed)
Handouts were given to you ondiverticulosis and hemorrhoids. You may resume your current medications today. Repeat colonoscopy in 5 years for screening purposes. Please call if any questions or concerns.     YOU HAD AN ENDOSCOPIC PROCEDURE TODAY AT THE [facility]:   Refer to the procedure report that was given to you for any specific questions about what was found during the examination.  If the procedure report does not answer your questions, please call your gastroenterologist to clarify.  If you requested that your care partner not be given the details of your procedure findings, then the procedure report has been included in a sealed envelope for you to review at your convenience later.  YOU SHOULD EXPECT: Some feelings of bloating in the abdomen. Passage of more gas than usual.  Walking can help get rid of the air that was put into your GI tract during the procedure and reduce the bloating. If you had a lower endoscopy (such as a colonoscopy or flexible sigmoidoscopy) you may notice spotting of blood in your stool or on the toilet paper. If you underwent a bowel prep for your procedure, you may not have a normal bowel movement for a few days.  Please Note:  You might notice some irritation and congestion in your nose or some drainage.  This is from the oxygen used during your procedure.  There is no need for concern and it should clear up in a day or so.  SYMPTOMS TO REPORT IMMEDIATELY:   Following lower endoscopy (colonoscopy or flexible sigmoidoscopy):  Excessive amounts of blood in the stool  Significant tenderness or worsening of abdominal pains  Swelling of the abdomen that is new, acute  Fever of 100F or higher   For urgent or emergent issues, a gastroenterologist can be reached at any hour by calling [phone]. Do not use MyChart messaging for urgent concerns.    DIET:  We do recommend a small meal at first, but then you may proceed to your regular diet.   Drink plenty of fluids but you should avoid alcoholic beverages for 24 hours.  ACTIVITY:  You should plan to take it easy for the rest of today and you should NOT DRIVE or use heavy machinery until tomorrow (because of the sedation medicines used during the test).    FOLLOW UP: Our staff will call the number listed on your records 48-72 hours following your procedure to check on you and address any questions or concerns that you may have regarding the information given to you following your procedure. If we do not reach you, we will leave a message.  We will attempt to reach you two times.  During this call, we will ask if you have developed any symptoms of COVID 19. If you develop any symptoms (ie: fever, flu-like symptoms, shortness of breath, cough etc.) before then, please call [phone].  If you test positive for Covid 19 in the 2 weeks post procedure, please call and report this information to [place].    If any biopsies were taken you will be contacted by phone or by letter within the next 1-3 weeks.  Please call us at [phone] if you have not heard about the biopsies in 3 weeks.    SIGNATURES/CONFIDENTIALITY: You and/or your care partner have signed paperwork which will be entered into your electronic medical record.  These signatures attest to the fact that that the information above on your After Visit Summary has been reviewed and is understood.  Full responsibility of the confidentiality of this discharge information lies with you and/or your care-partner.

## 2020-11-07 NOTE — Op Note (Signed)
[facility] Patient Name: Angel Merritt Procedure Date: 11/07/2020 10:25 AM MRN: [id number] Endoscopist: Napoleon Form , MD Age: 54 Referring MD:  Date of Birth: 1966/05/05 Gender: Male Account #: [id number] Procedure:                Colonoscopy Indications:              Colon cancer screening in patient at increased                            risk: Colorectal cancer in father Medicines:                Monitored Anesthesia Care Procedure:                Pre-Anesthesia Assessment:                           - Prior to the procedure, a History and Physical                            was performed, and patient medications and                            allergies were reviewed. The patient's tolerance of                            previous anesthesia was also reviewed. The risks                            and benefits of the procedure and the sedation                            options and risks were discussed with the patient.                            All questions were answered, and informed consent                            was obtained. Prior Anticoagulants: The patient has                            taken no previous anticoagulant or antiplatelet                            agents. ASA Grade Assessment: III - A patient with                            severe systemic disease. After reviewing the risks                            and benefits, the patient was deemed in                            satisfactory condition to undergo the procedure.  After obtaining informed consent, the colonoscope                            was passed under direct vision. Throughout the                            procedure, the patient's blood pressure, pulse, and                            oxygen saturations were monitored continuously. The                            Colonoscope was introduced through the anus and                            advanced to the the  cecum, identified by                            appendiceal orifice and ileocecal valve. The                            colonoscopy was performed without difficulty. The                            patient tolerated the procedure well. The quality                            of the bowel preparation was good. The ileocecal                            valve, appendiceal orifice, and rectum were                            photographed. Scope In: 10:35:25 AM Scope Out: 10:50:38 AM Scope Withdrawal Time: 0 hours 10 minutes 43 seconds  Total Procedure Duration: 0 hours 15 minutes 13 seconds  Findings:                 The perianal and digital rectal examinations were                            normal.                           Scattered small-mouthed diverticula were found in                            the sigmoid colon.                           Non-bleeding internal hemorrhoids were found during                            retroflexion. The hemorrhoids were medium-sized.  The exam was otherwise without abnormality. Complications:            No immediate complications. Estimated Blood Loss:     Estimated blood loss was minimal. Impression:               - Diverticulosis in the sigmoid colon.                           - Non-bleeding internal hemorrhoids.                           - The examination was otherwise normal.                           - No specimens collected. Recommendation:           - Patient has a contact number available for                            emergencies. The signs and symptoms of potential                            delayed complications were discussed with the                            patient. Return to normal activities tomorrow.                            Written discharge instructions were provided to the                            patient.                           - Resume previous diet.                           - Continue present  medications.                           - Repeat colonoscopy in 5 years for screening                            purposes. Napoleon Form, MD 11/07/2020 10:56:13 AM This report has been signed electronically.

## 2020-11-11 ENCOUNTER — Telehealth: Payer: Self-pay | Admitting: *Deleted

## 2020-11-11 NOTE — Telephone Encounter (Signed)
  Follow up Call-  Call back number 11/07/2020  Post procedure Call Back phone  # [phone]  Permission to leave phone message Yes  Some recent data might be hidden    [facility]

## 2020-11-11 NOTE — Telephone Encounter (Signed)
  Follow up Call-  Call back number 11/07/2020  Post procedure Call Back phone  # [phone]  Permission to leave phone message Yes  Some recent data might be hidden     Patient questions:  Do you have a fever, pain , or abdominal swelling? No. Pain Score  0 *  Have you tolerated food without any problems? Yes.    Have you been able to return to your normal activities? Yes.    Do you have any questions about your discharge instructions: Diet   No. Medications  No. Follow up visit  No.  Do you have questions or concerns about your Care? No.  Actions: * If pain score is 4 or above: 1. No action needed, pain <4.Have you developed a fever since your procedure? no  2.   Have you had an respiratory symptoms (SOB or cough) since your procedure? no  3.   Have you tested positive for COVID 19 since your procedure no  4.   Have you had any family members/close contacts diagnosed with the COVID 19 since your procedure?  no   If yes to any of these questions please route to Laverna Peace, RN and Karlton Lemon, RN

## 2020-11-18 ENCOUNTER — Other Ambulatory Visit (HOSPITAL_COMMUNITY)
Admission: RE | Admit: 2020-11-18 | Discharge: 2020-11-18 | Disposition: A | Discharge status: Home / Self Care | Payer: Managed Care, Other (non HMO) | Source: Ambulatory Visit | Attending: General Surgery | Admitting: General Surgery

## 2020-11-18 ENCOUNTER — Other Ambulatory Visit: Payer: Self-pay

## 2020-11-18 ENCOUNTER — Encounter (HOSPITAL_BASED_OUTPATIENT_CLINIC_OR_DEPARTMENT_OTHER): Payer: Self-pay | Admitting: General Surgery

## 2020-11-18 DIAGNOSIS — Z20822 Contact with and (suspected) exposure to covid-19: Secondary | ICD-10-CM | POA: Diagnosis not present

## 2020-11-18 DIAGNOSIS — Z01812 Encounter for preprocedural laboratory examination: Secondary | ICD-10-CM | POA: Diagnosis not present

## 2020-11-18 LAB — SARS CORONAVIRUS 2 (TAT 6-24 HRS): SARS Coronavirus 2: NEGATIVE

## 2020-11-18 NOTE — Progress Notes (Signed)
Spoke w/ via phone for pre-op interview--- PT Lab needs dos----  No (per anes.)/  Pre-op orders pending             Lab results------ no COVID test ------ 11-18-2020 @ 1035 Arrive at ------- 0900 NPO after MN NO Solid Food.  Clear liquids from MN until--- 0800 Medications to take morning of surgery ----- NONE Diabetic medication ----- n/a Patient Special Instructions ----- n/a Pre-Op special Istructions ----- sent inbox message to dr Derrell Lolling requested pre-op orders in epic Patient verbalized understanding of instructions that were given at this phone interview. Patient denies shortness of breath, chest pain, fever, cough at this phone interview.

## 2020-11-19 ENCOUNTER — Ambulatory Visit: Payer: Self-pay | Admitting: General Surgery

## 2020-11-19 NOTE — H&P (Signed)
History of Present Illness (Angel Swindle MD; 10/27/2020 9:28 AM) The patient is a 54 year old male who presents with an umbilical hernia. Chief Complaint: Umbilical hernia  Patient is a 54-year-old male, with a 4-5 year history of periumbilical primary hernia. He states that the areas gotten larger. He has been seen and seen secondary to the hernia here. He states he's had no pain or signs or symptoms of incarceration or strangulation. He's had no previous abdominal surgery.     Allergies (Chanel Nolan, CMA; 10/27/2020 9:23 AM) No Known Drug Allergies  [06/18/2016]: Allergies Reconciled   Medication History (Chanel Nolan, CMA; 10/27/2020 9:23 AM) Zantac (150MG Capsule, Oral) Active. Multiple Vitamin (Oral) Active. Ibuprofen (200MG Tablet, Oral) Active. Betamethasone Valerate (0.1% Ointment, External) Active. Revatio (20MG Tablet, Oral) Active. Medications Reconciled    Review of Systems (Harland Aguiniga, MD; 10/27/2020 9:30 AM) General Present- Appetite Loss. Not Present- Chills, Fatigue, Fever, Night Sweats, Weight Gain and Weight Loss. HEENT Present- Ringing in the Ears. Not Present- Earache, Hearing Loss, Hoarseness, Nose Bleed, Oral Ulcers, Seasonal Allergies, Sinus Pain, Sore Throat, Visual Disturbances, Wears glasses/contact lenses and Yellow Eyes. Respiratory Not Present- Bloody sputum, Chronic Cough, Difficulty Breathing, Snoring and Wheezing. Breast Present- Nipple Discharge. Not Present- Breast Mass, Breast Pain and Skin Changes. Cardiovascular Present- Leg Cramps and Swelling of Extremities. Not Present- Chest Pain, Difficulty Breathing Lying Down, Palpitations, Rapid Heart Rate and Shortness of Breath. Gastrointestinal Present- Change in Bowel Habits. Not Present- Abdominal Pain, Bloating, Bloody Stool, Chronic diarrhea, Constipation, Difficulty Swallowing, Excessive gas, Gets full quickly at meals, Hemorrhoids, Indigestion, Nausea, Rectal Pain and  Vomiting. Neurological Present- Headaches, Seizures, Tremor and Weakness. Not Present- Decreased Memory, Fainting, Numbness, Tingling and Trouble walking. Psychiatric Present- Depression. Not Present- Anxiety, Bipolar, Change in Sleep Pattern, Fearful and Frequent crying. Endocrine Present- Cold Intolerance. Not Present- Excessive Hunger, Hair Changes, Heat Intolerance and New Diabetes. Hematology Not Present- Blood Thinners, Easy Bruising, Excessive bleeding, Gland problems, HIV and Persistent Infections. All other systems negative  Vitals (Chanel Nolan CMA; 10/27/2020 9:24 AM) 10/27/2020 9:23 AM Weight: 312.13 lb Height: 72in Body Surface Area: 2.57 m Body Mass Index: 42.33 kg/m  Temp.: 97.9F  Pulse: 86 (Regular)  BP: 130/82(Sitting, Left Arm, Standard)       Physical Exam (Leondra Cullin MD; 10/27/2020 9:29 AM) The physical exam findings are as follows: Note: Constitutional: No acute distress, conversant, appears stated age  Eyes: Anicteric sclerae, moist conjunctiva, no lid lag  Neck: No thyromegaly, trachea midline, no cervical lymphadenopathy  Lungs: Clear to auscultation biilaterally, normal respiratory effot  Cardiovascular: regular rate & rhythm, no murmurs, no peripheal edema, pedal pulses 2+  GI: Soft, no masses or hepatosplenomegaly, non-tender to palpation  MSK: Normal gait, no clubbing cyanosis, edema  Skin: No rashes, palpation reveals normal skin turgor  Psychiatric: Appropriate judgment and insight, oriented to person, place, and time  Abdomen Inspection Hernias - Umbilical hernia - Reducible (Approximately 2-3 cm) .    Assessment & Plan (Danira Nylander MD; 10/27/2020 9:29 AM) UMBILICAL HERNIA WITHOUT OBSTRUCTION AND WITHOUT GANGRENE (K42.9) Impression: Patient is a 54-year-old male with a primary umbilical hernia.  1. The patient will like to proceed to the operating room for laparoscopic umbilical hernia repair with mesh.  2. I  discussed with the patient the signs and symptoms of incarceration and strangulation and the need to proceed to the ER should they occur.  3. I discussed with the patient the risks and benefits of the procedure to include but not   limited to: Infection, bleeding, damage to surrounding structures, possible need for further surgery, possible nerve pain, and possible recurrence. The patient was understanding and wishes to proceed.

## 2020-11-19 NOTE — H&P (View-Only) (Signed)
History of Present Illness Angel Filler MD; 10/27/2020 9:28 AM) The patient is a 54 year old male who presents with an umbilical hernia. Chief Complaint: Umbilical hernia  Patient is a 54 year old male, with a 4-5 year history of periumbilical primary hernia. He states that the areas gotten larger. He has been seen and seen secondary to the hernia here. He states he's had no pain or signs or symptoms of incarceration or strangulation. He's had no previous abdominal surgery.     Allergies (Chanel Lonni Fix, CMA; 10/27/2020 9:23 AM) No Known Drug Allergies  [06/18/2016]: Allergies Reconciled   Medication History (Chanel Lonni Fix, CMA; 10/27/2020 9:23 AM) Zantac (150MG  Capsule, Oral) Active. Multiple Vitamin (Oral) Active. Ibuprofen (200MG  Tablet, Oral) Active. Betamethasone Valerate (0.1% Ointment, External) Active. Revatio (20MG  Tablet, Oral) Active. Medications Reconciled    Review of Systems , MD; 10/27/2020 9:30 AM) General Present- Appetite Loss. Not Present- Chills, Fatigue, Fever, Night Sweats, Weight Gain and Weight Loss. HEENT Present- Ringing in the Ears. Not Present- Earache, Hearing Loss, Hoarseness, Nose Bleed, Oral Ulcers, Seasonal Allergies, Sinus Pain, Sore Throat, Visual Disturbances, Wears glasses/contact lenses and Yellow Eyes. Respiratory Not Present- Bloody sputum, Chronic Cough, Difficulty Breathing, Snoring and Wheezing. Breast Present- Nipple Discharge. Not Present- Breast Mass, Breast Pain and Skin Changes. Cardiovascular Present- Leg Cramps and Swelling of Extremities. Not Present- Chest Pain, Difficulty Breathing Lying Down, Palpitations, Rapid Heart Rate and Shortness of Breath. Gastrointestinal Present- Change in Bowel Habits. Not Present- Abdominal Pain, Bloating, Bloody Stool, Chronic diarrhea, Constipation, Difficulty Swallowing, Excessive gas, Gets full quickly at meals, Hemorrhoids, Indigestion, Nausea, Rectal Pain and  Vomiting. Neurological Present- Headaches, Seizures, Tremor and Weakness. Not Present- Decreased Memory, Fainting, Numbness, Tingling and Trouble walking. Psychiatric Present- Depression. Not Present- Anxiety, Bipolar, Change in Sleep Pattern, Fearful and Frequent crying. Endocrine Present- Cold Intolerance. Not Present- Excessive Hunger, Hair Changes, Heat Intolerance and New Diabetes. Hematology Not Present- Blood Thinners, Easy Bruising, Excessive bleeding, Gland problems, HIV and Persistent Infections. All other systems negative  Vitals (Chanel Nolan CMA; 10/27/2020 9:24 AM) 10/27/2020 9:23 AM Weight: 312.13 lb Height: 72in Body Surface Area: 2.57 m Body Mass Index: 42.33 kg/m  Temp.: 97.74F  Pulse: 86 (Regular)  BP: 130/82(Sitting, Left Arm, Standard)       Physical Exam 10/29/2020 MD; 10/27/2020 9:29 AM) The physical exam findings are as follows: Note: Constitutional: No acute distress, conversant, appears stated age  Eyes: Anicteric sclerae, moist conjunctiva, no lid lag  Neck: No thyromegaly, trachea midline, no cervical lymphadenopathy  Lungs: Clear to auscultation biilaterally, normal respiratory effot  Cardiovascular: regular rate & rhythm, no murmurs, no peripheal edema, pedal pulses 2+  GI: Soft, no masses or hepatosplenomegaly, non-tender to palpation  MSK: Normal gait, no clubbing cyanosis, edema  Skin: No rashes, palpation reveals normal skin turgor  Psychiatric: Appropriate judgment and insight, oriented to person, place, and time  Abdomen Inspection Hernias - Umbilical hernia - Reducible (Approximately 2-3 cm) .    Assessment & Plan 10/29/2020 MD; 10/27/2020 9:29 AM) UMBILICAL HERNIA WITHOUT OBSTRUCTION AND WITHOUT GANGRENE (K42.9) Impression: Patient is a 54 year old male with a primary umbilical hernia.  1. The patient will like to proceed to the operating room for laparoscopic umbilical hernia repair with mesh.  2. I  discussed with the patient the signs and symptoms of incarceration and strangulation and the need to proceed to the ER should they occur.  3. I discussed with the patient the risks and benefits of the procedure to include but not  limited to: Infection, bleeding, damage to surrounding structures, possible need for further surgery, possible nerve pain, and possible recurrence. The patient was understanding and wishes to proceed.

## 2020-11-21 ENCOUNTER — Ambulatory Visit (HOSPITAL_BASED_OUTPATIENT_CLINIC_OR_DEPARTMENT_OTHER): Payer: Managed Care, Other (non HMO) | Admitting: Anesthesiology

## 2020-11-21 ENCOUNTER — Encounter (HOSPITAL_BASED_OUTPATIENT_CLINIC_OR_DEPARTMENT_OTHER)
Admission: RE | Disposition: A | Discharge status: Home / Self Care | Payer: Self-pay | Source: Home / Self Care | Attending: General Surgery

## 2020-11-21 ENCOUNTER — Ambulatory Visit (HOSPITAL_BASED_OUTPATIENT_CLINIC_OR_DEPARTMENT_OTHER)
Admission: RE | Admit: 2020-11-21 | Discharge: 2020-11-21 | Disposition: A | Discharge status: Home / Self Care | Payer: Managed Care, Other (non HMO) | Attending: General Surgery | Admitting: General Surgery

## 2020-11-21 ENCOUNTER — Encounter (HOSPITAL_BASED_OUTPATIENT_CLINIC_OR_DEPARTMENT_OTHER): Payer: Self-pay | Admitting: General Surgery

## 2020-11-21 ENCOUNTER — Other Ambulatory Visit: Payer: Self-pay

## 2020-11-21 DIAGNOSIS — Z79899 Other long term (current) drug therapy: Secondary | ICD-10-CM | POA: Insufficient documentation

## 2020-11-21 DIAGNOSIS — K420 Umbilical hernia with obstruction, without gangrene: Secondary | ICD-10-CM | POA: Insufficient documentation

## 2020-11-21 DIAGNOSIS — K429 Umbilical hernia without obstruction or gangrene: Secondary | ICD-10-CM | POA: Diagnosis present

## 2020-11-21 HISTORY — DX: Gastro-esophageal reflux disease without esophagitis: K21.9

## 2020-11-21 HISTORY — DX: Male erectile dysfunction, unspecified: N52.9

## 2020-11-21 HISTORY — PX: UMBILICAL HERNIA REPAIR: SHX196

## 2020-11-21 HISTORY — DX: Presence of spectacles and contact lenses: Z97.3

## 2020-11-21 HISTORY — DX: Hyperlipidemia, unspecified: E78.5

## 2020-11-21 HISTORY — DX: Umbilical hernia without obstruction or gangrene: K42.9

## 2020-11-21 HISTORY — DX: Unspecified osteoarthritis, unspecified site: M19.90

## 2020-11-21 SURGERY — REPAIR, HERNIA, UMBILICAL, LAPAROSCOPIC
Anesthesia: General

## 2020-11-21 MED ORDER — DEXAMETHASONE SODIUM PHOSPHATE 10 MG/ML IJ SOLN
INTRAMUSCULAR | Status: DC | PRN
  Administered 2020-11-21: 10 mg via INTRAVENOUS

## 2020-11-21 MED ORDER — FENTANYL CITRATE (PF) 100 MCG/2ML IJ SOLN
INTRAMUSCULAR | Status: DC | PRN
  Administered 2020-11-21: 50 ug via INTRAVENOUS
  Administered 2020-11-21 (×2): 100 ug via INTRAVENOUS

## 2020-11-21 MED ORDER — MIDAZOLAM HCL 5 MG/5ML IJ SOLN
INTRAMUSCULAR | Status: DC | PRN
  Administered 2020-11-21: 2 mg via INTRAVENOUS

## 2020-11-21 MED ORDER — DEXTROSE 5 % IV SOLN
INTRAVENOUS | Status: DC | PRN
  Administered 2020-11-21: 3 g via INTRAVENOUS

## 2020-11-21 MED ORDER — TRAMADOL HCL 50 MG PO TABS
50.0000 mg | ORAL_TABLET | Freq: Four times a day (QID) | ORAL | 0 refills | Status: DC | PRN
Start: 2020-11-21 — End: 2021-09-07

## 2020-11-21 MED ORDER — KETOROLAC TROMETHAMINE 30 MG/ML IJ SOLN
INTRAMUSCULAR | Status: AC
  Filled 2020-11-21: qty 1

## 2020-11-21 MED ORDER — KETOROLAC TROMETHAMINE 30 MG/ML IJ SOLN
INTRAMUSCULAR | Status: DC | PRN
  Administered 2020-11-21: 30 mg via INTRAVENOUS

## 2020-11-21 MED ORDER — SUGAMMADEX SODIUM 200 MG/2ML IV SOLN
INTRAVENOUS | Status: DC | PRN
  Administered 2020-11-21 (×2): 200 mg via INTRAVENOUS

## 2020-11-21 MED ORDER — MIDAZOLAM HCL 2 MG/2ML IJ SOLN
INTRAMUSCULAR | Status: AC
  Filled 2020-11-21: qty 2

## 2020-11-21 MED ORDER — TRAMADOL HCL 50 MG PO TABS
50.0000 mg | ORAL_TABLET | Freq: Once | ORAL | Status: AC
  Administered 2020-11-21: 50 mg via ORAL

## 2020-11-21 MED ORDER — LIDOCAINE HCL (PF) 2 % IJ SOLN
INTRAMUSCULAR | Status: AC
  Filled 2020-11-21: qty 5

## 2020-11-21 MED ORDER — PROPOFOL 10 MG/ML IV BOLUS
INTRAVENOUS | Status: DC | PRN
Start: 2020-11-21 — End: 2020-11-21
  Administered 2020-11-21: 200 mg via INTRAVENOUS

## 2020-11-21 MED ORDER — ROCURONIUM BROMIDE 10 MG/ML (PF) SYRINGE
PREFILLED_SYRINGE | INTRAVENOUS | Status: AC
  Filled 2020-11-21: qty 10

## 2020-11-21 MED ORDER — CEFAZOLIN SODIUM-DEXTROSE 2-4 GM/100ML-% IV SOLN
INTRAVENOUS | Status: AC
  Filled 2020-11-21: qty 100

## 2020-11-21 MED ORDER — ACETAMINOPHEN 500 MG PO TABS
1000.0000 mg | ORAL_TABLET | ORAL | Status: AC
  Administered 2020-11-21: 1000 mg via ORAL

## 2020-11-21 MED ORDER — ONDANSETRON HCL 4 MG/2ML IJ SOLN
INTRAMUSCULAR | Status: AC
  Filled 2020-11-21: qty 2

## 2020-11-21 MED ORDER — ACETAMINOPHEN 500 MG PO TABS
ORAL_TABLET | ORAL | Status: AC
  Filled 2020-11-21: qty 2

## 2020-11-21 MED ORDER — ROCURONIUM BROMIDE 10 MG/ML (PF) SYRINGE
PREFILLED_SYRINGE | INTRAVENOUS | Status: DC | PRN
  Administered 2020-11-21: 40 mg via INTRAVENOUS
  Administered 2020-11-21: 10 mg via INTRAVENOUS

## 2020-11-21 MED ORDER — FENTANYL CITRATE (PF) 250 MCG/5ML IJ SOLN
INTRAMUSCULAR | Status: AC
  Filled 2020-11-21: qty 5

## 2020-11-21 MED ORDER — SUCCINYLCHOLINE CHLORIDE 200 MG/10ML IV SOSY
PREFILLED_SYRINGE | INTRAVENOUS | Status: DC | PRN
  Administered 2020-11-21: 160 mg via INTRAVENOUS

## 2020-11-21 MED ORDER — BUPIVACAINE HCL 0.25 % IJ SOLN
INTRAMUSCULAR | Status: DC | PRN
  Administered 2020-11-21: 11 mL

## 2020-11-21 MED ORDER — ONDANSETRON HCL 4 MG/2ML IJ SOLN
INTRAMUSCULAR | Status: DC | PRN
  Administered 2020-11-21: 4 mg via INTRAVENOUS

## 2020-11-21 MED ORDER — CEFAZOLIN SODIUM-DEXTROSE 2-4 GM/100ML-% IV SOLN: 2.0000 g | INTRAVENOUS | Status: DC

## 2020-11-21 MED ORDER — CEFAZOLIN SODIUM-DEXTROSE 1-4 GM/50ML-% IV SOLN
INTRAVENOUS | Status: AC
  Filled 2020-11-21: qty 50

## 2020-11-21 MED ORDER — PROPOFOL 10 MG/ML IV BOLUS
INTRAVENOUS | Status: AC
  Filled 2020-11-21: qty 20

## 2020-11-21 MED ORDER — CHLORHEXIDINE GLUCONATE CLOTH 2 % EX PADS: 6.0000 | MEDICATED_PAD | Freq: Once | CUTANEOUS | Status: DC

## 2020-11-21 MED ORDER — TRAMADOL HCL 50 MG PO TABS
ORAL_TABLET | ORAL | Status: AC
  Filled 2020-11-21: qty 1

## 2020-11-21 MED ORDER — LIDOCAINE 2% (20 MG/ML) 5 ML SYRINGE
INTRAMUSCULAR | Status: DC | PRN
  Administered 2020-11-21: 80 mg via INTRAVENOUS

## 2020-11-21 MED ORDER — LACTATED RINGERS IV SOLN
INTRAVENOUS | Status: DC
  Administered 2020-11-21: 1000 mL via INTRAVENOUS

## 2020-11-21 MED ORDER — DEXAMETHASONE SODIUM PHOSPHATE 10 MG/ML IJ SOLN
INTRAMUSCULAR | Status: AC
  Filled 2020-11-21: qty 1

## 2020-11-21 MED ORDER — FENTANYL CITRATE (PF) 100 MCG/2ML IJ SOLN: 25.0000 ug | INTRAMUSCULAR | Status: DC | PRN

## 2020-11-21 MED ORDER — SUCCINYLCHOLINE CHLORIDE 200 MG/10ML IV SOSY
PREFILLED_SYRINGE | INTRAVENOUS | Status: AC
  Filled 2020-11-21: qty 10

## 2020-11-21 SURGICAL SUPPLY — 46 items
ADH SKN CLS APL DERMABOND .7 (GAUZE/BANDAGES/DRESSINGS) ×1
APL PRP STRL LF DISP 70% ISPRP (MISCELLANEOUS) ×1
APPLIER CLIP LOGIC TI 5 (MISCELLANEOUS) IMPLANT
APPLIER CLIP ROT 10 11.4 M/L (STAPLE)
APR CLP MED LRG 11.4X10 (STAPLE)
APR CLP MED LRG 33X5 (MISCELLANEOUS)
BINDER ABDOMINAL 12 SM 30-45 (SOFTGOODS) IMPLANT
BLADE CLIPPER SENSICLIP SURGIC (BLADE) ×1 IMPLANT
CABLE HIGH FREQUENCY MONO STRZ (ELECTRODE) ×1 IMPLANT
CANISTER SUCT 3000ML PPV (MISCELLANEOUS) IMPLANT
CHLORAPREP W/TINT 26 (MISCELLANEOUS) ×2 IMPLANT
CLIP APPLIE ROT 10 11.4 M/L (STAPLE) IMPLANT
COVER WAND RF STERILE (DRAPES) ×2 IMPLANT
DERMABOND ADVANCED (GAUZE/BANDAGES/DRESSINGS) ×1
DERMABOND ADVANCED .7 DNX12 (GAUZE/BANDAGES/DRESSINGS) ×1 IMPLANT
DEVICE SECURE STRAP 25 ABSORB (INSTRUMENTS) ×3 IMPLANT
DEVICE TROCAR PUNCTURE CLOSURE (ENDOMECHANICALS) ×2 IMPLANT
DRAPE UTILITY XL STRL (DRAPES) ×2 IMPLANT
ELECT REM PT RETURN 9FT ADLT (ELECTROSURGICAL) ×2
ELECTRODE REM PT RTRN 9FT ADLT (ELECTROSURGICAL) ×1 IMPLANT
GLOVE BIO SURGEON STRL SZ7.5 (GLOVE) ×2 IMPLANT
GOWN STRL REUS W/TWL LRG LVL3 (GOWN DISPOSABLE) ×2 IMPLANT
KIT TURNOVER CYSTO (KITS) ×2 IMPLANT
MESH VENTRALIGHT ST 4X6IN (Mesh General) ×1 IMPLANT
NDL SPNL 22GX3.5 QUINCKE BK (NEEDLE) IMPLANT
NEEDLE INSUFFLATION 120MM (ENDOMECHANICALS) ×2 IMPLANT
NEEDLE SPNL 22GX3.5 QUINCKE BK (NEEDLE) IMPLANT
NS IRRIG 1000ML POUR BTL (IV SOLUTION) ×1 IMPLANT
NS IRRIG 500ML POUR BTL (IV SOLUTION) ×1 IMPLANT
PACK BASIN DAY SURGERY FS (CUSTOM PROCEDURE TRAY) ×2 IMPLANT
PAD ARMBOARD 7.5X6 YLW CONV (MISCELLANEOUS) ×2 IMPLANT
SCISSORS LAP 5X35 DISP (ENDOMECHANICALS) ×2 IMPLANT
SET SUCTION IRRIG HYDROSURG (IRRIGATION / IRRIGATOR) IMPLANT
SET TUBE SMOKE EVAC HIGH FLOW (TUBING) ×2 IMPLANT
SUT CHROMIC 2 0 SH (SUTURE) ×2 IMPLANT
SUT ETHIBOND 0 MO6 C/R (SUTURE) ×1 IMPLANT
SUT MNCRL AB 4-0 PS2 18 (SUTURE) ×2 IMPLANT
SUT NOVA 1 T20/GS 25DT (SUTURE) IMPLANT
SUT PROLENE 2 0 KS (SUTURE) IMPLANT
TRAY FOL W/BAG SLVR 16FR STRL (SET/KITS/TRAYS/PACK) IMPLANT
TRAY FOLEY W/BAG SLVR 16FR LF (SET/KITS/TRAYS/PACK)
TRAY LAPAROSCOPIC (CUSTOM PROCEDURE TRAY) ×2 IMPLANT
TROCAR BLADELESS OPT 5 100 (ENDOMECHANICALS) ×4 IMPLANT
TROCAR XCEL BLUNT TIP 100MML (ENDOMECHANICALS) IMPLANT
TROCAR XCEL NON-BLD 11X100MML (ENDOMECHANICALS) IMPLANT
WATER STERILE IRR 1000ML POUR (IV SOLUTION) ×2 IMPLANT

## 2020-11-21 NOTE — Interval H&P Note (Signed)
History and Physical Interval Note:  11/21/2020 9:53 AM  Angel Merritt  has presented today for surgery, with the diagnosis of umbilical hernia.  The various methods of treatment have been discussed with the patient and family. After consideration of risks, benefits and other options for treatment, the patient has consented to  Procedure(s): LAPAROSCOPIC UMBILICAL HERNIA WITH MESH (N/A) as a surgical intervention.  The patient's history has been reviewed, patient examined, no change in status, stable for surgery.  I have reviewed the patient's chart and labs.  Questions were answered to the patient's satisfaction.     Axel Filler

## 2020-11-21 NOTE — Anesthesia Procedure Notes (Signed)
Procedure Name: Intubation Date/Time: 11/21/2020 10:14 AM Performed by: Rogers Blocker, CRNA Pre-anesthesia Checklist: Patient identified, Emergency Drugs available, Suction available and Patient being monitored Patient Re-evaluated:Patient Re-evaluated prior to induction Oxygen Delivery Method: Circle System Utilized Preoxygenation: Pre-oxygenation with 100% oxygen Induction Type: IV induction Ventilation: Mask ventilation without difficulty Laryngoscope Size: Mac and 4 Grade View: Grade I Tube type: Oral Tube size: 7.5 mm Number of attempts: 1 Airway Equipment and Method: Stylet Placement Confirmation: ETT inserted through vocal cords under direct vision,  positive ETCO2 and breath sounds checked- equal and bilateral Secured at: 22 cm Tube secured with: Tape Dental Injury: Teeth and Oropharynx as per pre-operative assessment

## 2020-11-21 NOTE — Anesthesia Preprocedure Evaluation (Signed)
Anesthesia Evaluation  Patient identified by MRN, date of birth, ID band Patient awake    Reviewed: Allergy & Precautions, NPO status , Patient's Chart, lab work & pertinent test results  Airway Mallampati: II  TM Distance: >3 FB     Dental   Pulmonary neg pulmonary ROS,    breath sounds clear to auscultation       Cardiovascular hypertension,  Rhythm:Regular Rate:Normal     Neuro/Psych  Headaches,    GI/Hepatic Neg liver ROS, GERD  ,  Endo/Other  negative endocrine ROS  Renal/GU negative Renal ROS     Musculoskeletal   Abdominal   Peds  Hematology   Anesthesia Other Findings   Reproductive/Obstetrics                             Anesthesia Physical Anesthesia Plan  ASA: III  Anesthesia Plan: General   Post-op Pain Management:    Induction: Intravenous  PONV Risk Score and Plan: 2 and Ondansetron, Dexamethasone and Midazolam  Airway Management Planned: Oral ETT  Additional Equipment:   Intra-op Plan:   Post-operative Plan: Extubation in OR  Informed Consent: I have reviewed the patients History and Physical, chart, labs and discussed the procedure including the risks, benefits and alternatives for the proposed anesthesia with the patient or authorized representative who has indicated his/her understanding and acceptance.       Plan Discussed with: CRNA, Anesthesiologist and Surgeon  Anesthesia Plan Comments:         Anesthesia Quick Evaluation

## 2020-11-21 NOTE — Discharge Instructions (Signed)
[facility] _______[facility] ° °HERNIA REPAIR: POST OP INSTRUCTIONS ° °Always review your discharge instruction sheet given to you by the facility where your surgery was performed. °IF YOU HAVE DISABILITY OR FAMILY LEAVE FORMS, YOU MUST BRING THEM TO THE OFFICE FOR PROCESSING.   °DO NOT GIVE THEM TO YOUR DOCTOR. ° °1. A  prescription for pain medication may be given to you upon discharge.  Take your pain medication as prescribed, if needed.  If narcotic pain medicine is not needed, then you may take acetaminophen (Tylenol) or ibuprofen (Advil) as needed. °2. Take your usually prescribed medications unless otherwise directed. °If you need a refill on your pain medication, please contact your pharmacy.  They will contact our office to request authorization. Prescriptions will not be filled after 5 pm or on week-ends. °3. You should follow a light diet the first 24 hours after arrival home, such as soup and crackers, etc.  Be sure to include lots of fluids daily.  Resume your normal diet the day after surgery. °4.Most patients will experience some swelling and bruising around the umbilicus or in the groin and scrotum.  Ice packs and reclining will help.  Swelling and bruising can take several days to resolve.  °6. It is common to experience some constipation if taking pain medication after surgery.  Increasing fluid intake and taking a stool softener (such as Colace) will usually help or prevent this problem from occurring.  A mild laxative (Milk of Magnesia or Miralax) should be taken according to package directions if there are no bowel movements after 48 hours. °7. Unless discharge instructions indicate otherwise, you may remove your bandages 24-48 hours after surgery, and you may shower at that time.  You may have steri-strips (small skin tapes) in place directly over the incision.  These strips should be left on the skin for 7-10 days.  If your surgeon used skin glue on the incision, you may shower in  24 hours.  The glue will flake off over the next 2-3 weeks.  Any sutures or staples will be removed at the office during your follow-up visit. °8. ACTIVITIES:  You may resume regular (light) daily activities beginning the next day--such as daily self-care, walking, climbing stairs--gradually increasing activities as tolerated.  You may have sexual intercourse when it is comfortable.  Refrain from any heavy lifting or straining until approved by your doctor. ° °a.You may drive when you are no longer taking prescription pain medication, you can comfortably wear a seatbelt, and you can safely maneuver your car and apply brakes. °b.RETURN TO WORK:   °_____________________________________________ ° °9.You should see your doctor in the office for a follow-up appointment approximately 2-3 weeks after your surgery.  Make sure that you call for this appointment within a day or two after you arrive home to insure a convenient appointment time. °10.OTHER INSTRUCTIONS: _________________________ °   _____________________________________ ° °WHEN TO CALL YOUR DOCTOR: °1. Fever over 101.0 °2. Inability to urinate °3. Nausea and/or vomiting °4. Extreme swelling or bruising °5. Continued bleeding from incision. °6. Increased pain, redness, or drainage from the incision ° °The clinic staff is available to answer your questions during regular business hours.  Please don’t hesitate to call and ask to speak to one of the nurses for clinical concerns.  If you have a medical emergency, go to the nearest emergency room or call 911.  A surgeon from [facility] is always on call at the hospital ° ° °[address]   [address], [address], [place], [place]  [address] ?  [address] [address], [place], [place]   [address] [phone] ? [phone] ? FAX [phone] Web site: [email or web address]     Post Anesthesia Home Care Instructions  Activity: Get plenty of rest for the remainder of the day. A responsible individual  must stay with you for 24 hours following the procedure.  For the next 24 hours, DO NOT: -Drive a car -Advertising copywriter -Drink alcoholic beverages -Take any medication unless instructed by your physician -Make any legal decisions or sign important papers.  Meals: Start with liquid foods such as gelatin or soup. Progress to regular foods as tolerated. Avoid greasy, spicy, heavy foods. If nausea and/or vomiting occur, drink only clear liquids until the nausea and/or vomiting subsides. Call your physician if vomiting continues.  Special Instructions/Symptoms: Your throat may feel dry or sore from the anesthesia or the breathing tube placed in your throat during surgery. If this causes discomfort, gargle with warm salt water. The discomfort should disappear within 24 hours.  Do not take any Tylenol until after 3:30 pm today. Do not take any nonsteroidal anti inflammtories until after 4:45 pm today.

## 2020-11-21 NOTE — Anesthesia Postprocedure Evaluation (Signed)
Anesthesia Post Note  Patient: Angel Merritt  Procedure(s) Performed: LAPAROSCOPIC UMBILICAL HERNIA WITH MESH (N/A )     Patient location during evaluation: PACU Anesthesia Type: General Level of consciousness: awake Pain management: pain level controlled Vital Signs Assessment: post-procedure vital signs reviewed and stable Respiratory status: spontaneous breathing Cardiovascular status: stable Postop Assessment: no apparent nausea or vomiting Anesthetic complications: no   No complications documented.  Last Vitals:  Vitals:   11/21/20 1101 11/21/20 1115  BP: (!) 162/97 (!) 155/88  Pulse: 86 81  Resp:  16  Temp: 36.6 C   SpO2: 94% 97%    Last Pain:  Vitals:   11/21/20 0906  TempSrc: Oral  PainSc: 0-No pain                 Raydell Maners

## 2020-11-21 NOTE — Op Note (Signed)
11/21/2020  10:50 AM  PATIENT:  Angel Merritt  54 y.o. male  PRE-OPERATIVE DIAGNOSIS:  umbilical hernia  POST-OPERATIVE DIAGNOSIS:  Umbilical hernia  PROCEDURE:  Procedure(s): LAPAROSCOPIC UMBILICAL HERNIA WITH MESH (N/A)  SURGEON:  Surgeon(s) and Role:    * Axel Filler, MD - Primary  ANESTHESIA:   local and general  EBL:  minimal   BLOOD ADMINISTERED:none  DRAINS: none   LOCAL MEDICATIONS USED:  BUPIVICAINE   SPECIMEN:  No Specimen  DISPOSITION OF SPECIMEN:  N/A  COUNTS:  YES  TOURNIQUET:  * No tourniquets in log *  DICTATION: .Dragon Dictation  Details of the procedure:   After the patient was consented patient was taken back to the operating room patient was then placed in supine position bilateral SCDs in place.  The patient was prepped and draped in the usual sterile fashion. After antibiotics were confirmed a timeout was called and all facts were verified. The Veress needle technique was used to insuflate the abdomen at Palmer's point. The abdomen was insufflated to 14 mm mercury. Subsequently a 5 mm trocar was placed a camera inserted there was no injury to any intra-abdominal organs.    There was seen to be an incarcerated  umbilical hernia.  A second camera port was in placed into the left lower quadrant.   At this the Falicform ligament was taken down with Bovie cautery maintaining hemostasis.  I proceeded to reduce the hernia contents.  The hernia sac was dissected out of the hernia and disposed.  The fascia at the hernia was reapproximated using a #0 Eithbonds x 3.  Once the hernia was cleared away, a Bard Ventralight 15x10cm  mesh was inserted into the abdomen.  The mesh was secured circumferentially with am Securestrap tacker in a double crown fashion.    The omentum was brought over the area of the mesh. The pneumoperitoneum was evacuated  & all trocars  were removed. The skin was reapproximated with 4-0  Monocryl sutures in a subcuticular fashion. The  skin was dressed with Dermabond.  The patient was taken to the recovery room in stable condition.  Type of repair -primary suture & mesh  Mesh overlap - 5cm  Placement of mesh -  beneath fascia and into peritoneal cavity   PLAN OF CARE: Discharge to home after PACU  PATIENT DISPOSITION:  PACU - hemodynamically stable.   Delay start of Pharmacological VTE agent (>24hrs) due to surgical blood loss or risk of bleeding: not applicable

## 2020-11-21 NOTE — Transfer of Care (Signed)
Immediate Anesthesia Transfer of Care Note  Patient: Angel Merritt  Procedure(s) Performed: LAPAROSCOPIC UMBILICAL HERNIA WITH MESH (N/A )  Patient Location: PACU  Anesthesia Type:General  Level of Consciousness: oriented, drowsy and patient cooperative  Airway & Oxygen Therapy: Patient Spontanous Breathing and Patient connected to nasal cannula oxygen  Post-op Assessment: Report given to RN and Post -op Vital signs reviewed and stable  Post vital signs: Reviewed and stable  Last Vitals:  Vitals Value Taken Time  BP 162/97 11/21/20 1102  Temp 36.6 C 11/21/20 1101  Pulse 81 11/21/20 1107  Resp 15 11/21/20 1107  SpO2 93 % 11/21/20 1107  Vitals shown include unvalidated device data.  Last Pain:  Vitals:   11/21/20 0906  TempSrc: Oral  PainSc: 0-No pain      Patients Stated Pain Goal: 4 (11/21/20 0906)  Complications: No complications documented.

## 2020-11-24 ENCOUNTER — Encounter (HOSPITAL_BASED_OUTPATIENT_CLINIC_OR_DEPARTMENT_OTHER): Payer: Self-pay | Admitting: General Surgery

## 2020-12-15 ENCOUNTER — Other Ambulatory Visit (INDEPENDENT_AMBULATORY_CARE_PROVIDER_SITE_OTHER): Payer: BC Managed Care – PPO

## 2020-12-15 ENCOUNTER — Other Ambulatory Visit: Payer: Self-pay

## 2020-12-15 DIAGNOSIS — E538 Deficiency of other specified B group vitamins: Secondary | ICD-10-CM

## 2020-12-15 LAB — VITAMIN B12: Vitamin B-12: 306 pg/mL (ref 200–1100)

## 2020-12-16 ENCOUNTER — Encounter: Payer: Self-pay | Admitting: Family

## 2020-12-16 NOTE — Progress Notes (Signed)
Mailed out to pt 

## 2021-01-27 ENCOUNTER — Encounter: Payer: Self-pay | Admitting: Family Medicine

## 2021-01-27 ENCOUNTER — Ambulatory Visit: Payer: BC Managed Care – PPO | Admitting: Family Medicine

## 2021-01-27 ENCOUNTER — Other Ambulatory Visit: Payer: Self-pay

## 2021-01-27 ENCOUNTER — Ambulatory Visit (HOSPITAL_BASED_OUTPATIENT_CLINIC_OR_DEPARTMENT_OTHER)
Admission: RE | Admit: 2021-01-27 | Discharge: 2021-01-27 | Disposition: A | Discharge status: Home / Self Care | Payer: BC Managed Care – PPO | Source: Ambulatory Visit | Attending: Family Medicine | Admitting: Family Medicine

## 2021-01-27 VITALS — BP 130/86 | HR 91 | Temp 97.9°F | Ht 71.0 in | Wt 322.2 lb

## 2021-01-27 DIAGNOSIS — R2242 Localized swelling, mass and lump, left lower limb: Secondary | ICD-10-CM | POA: Insufficient documentation

## 2021-01-27 DIAGNOSIS — M25562 Pain in left knee: Secondary | ICD-10-CM

## 2021-01-27 DIAGNOSIS — R600 Localized edema: Secondary | ICD-10-CM | POA: Diagnosis not present

## 2021-01-27 DIAGNOSIS — E785 Hyperlipidemia, unspecified: Secondary | ICD-10-CM

## 2021-01-27 DIAGNOSIS — G8929 Other chronic pain: Secondary | ICD-10-CM

## 2021-01-27 LAB — COMPREHENSIVE METABOLIC PANEL
ALT: 20 U/L (ref 0–53)
AST: 12 U/L (ref 0–37)
Albumin: 4.3 g/dL (ref 3.5–5.2)
Alkaline Phosphatase: 90 U/L (ref 39–117)
BUN: 18 mg/dL (ref 6–23)
CO2: 28 mEq/L (ref 19–32)
Calcium: 9.4 mg/dL (ref 8.4–10.5)
Chloride: 100 mEq/L (ref 96–112)
Creatinine, Ser: 0.96 mg/dL (ref 0.40–1.50)
GFR: 90.93 mL/min (ref >60.00)
Glucose, Bld: 120 mg/dL — ABNORMAL HIGH (ref 70–99)
Potassium: 4.4 mEq/L (ref 3.5–5.1)
Sodium: 137 mEq/L (ref 135–145)
Total Bilirubin: 0.5 mg/dL (ref 0.2–1.2)
Total Protein: 6.9 g/dL (ref 6.0–8.3)

## 2021-01-27 LAB — TSH: TSH: 1.47 u[IU]/mL (ref 0.35–4.50)

## 2021-01-27 NOTE — Progress Notes (Signed)
Musculoskeletal Exam  Patient: Angel Merritt DOB: 1966/07/02  DOS: 01/27/2021  SUBJECTIVE:  Chief Complaint:   Chief Complaint  Patient presents with  . Knee Pain  . Joint Swelling    Left leg     Angel Merritt is a 55 y.o.  male for evaluation and treatment of L knee pain and b/l swelling, worse on the L.   3 months ago the patient started having bilateral lower extremity swelling.  It is worse on the left and is getting worse.  He has diffuse pain around the leg.  The skin is turning pinkish hue.  There is no redness or bruising.  He has no history of heart failure, kidney failure, or hepatic failure.  He denies any cough, fevers, or shortness of breath.  He did have hernia surgery and colonoscopy just prior to the swelling.  He has no personal or family history of clots.  Over the summer around 8 months ago, the patient got into his truck and hurt his knee.  His daughter is quite short and had the seat drawn closer to the steering wheel.  It will occasionally pop it does not catch or lock.  No redness or bruising with the knee.  He has some decreased range of motion with full flexion.    Past Medical History:  Diagnosis Date  . B12 deficiency 09/07/2020  . ED (erectile dysfunction)   . GERD (gastroesophageal reflux disease)   . Hyperlipidemia   . OA (osteoarthritis)   . Umbilical hernia   . Varicose veins   . Wears glasses     Objective: VITAL SIGNS: BP 130/86 (BP Location: Left Arm, Patient Position: Sitting, Cuff Size: Large)   Pulse 91   Temp 97.9 F (36.6 C) (Oral)   Ht 5\' 11"  (1.803 m)   Wt (!) 322 lb 4 oz (146.2 kg)   SpO2 98%   BMI 44.94 kg/m  Constitutional: Well formed, well developed. No acute distress. Thorax & Lungs: CTAB. No accessory muscle use Heart: RRR, 3+ pitting lower extremity edema on the left tapering at the proximal tibia near the knee; 2+ pitting lower extremity edema on the right tapering at the proximal third of the tibia Musculoskeletal: L  knee.   Normal active range of motion: no.   Normal passive range of motion: no Tenderness to palpation: Mild tenderness to palpation over the posterior lateral knee over the hamstring There is diffuse tenderness of the lower extremities with there is swelling, no localization to the calves Deformity: no Ecchymosis: no Tests positive: None Tests negative: Lachman's, varus/valgus stress, Stines, McMurray's, patellar apprehension/grind, Homans bilaterally Neurologic: Normal sensory function. No focal deficits noted. Psychiatric: Normal mood. Age appropriate judgment and insight. Alert & oriented x 3.    Assessment:  Localized swelling of left lower extremity - Plan: Venous Img Lower Unilateral Left, Comprehensive metabolic panel, TSH  Chronic pain of left knee  Plan: 1.  Given recent procedure, will check lower extremity duplex today.  Hopefully this is just dependent edema.  Counseled on exercise and diet.  Compression stocking prescription given.  Recommended elevating legs.  Will check labs as well.  Doubt heart failure. 2. Stretches/exercises, heat, ice, Tylenol.  F/u in 1 mo w reg PCP for knee pain. The patient voiced understanding and agreement to the plan.   [place], DO 01/27/21  7:47 AM

## 2021-01-27 NOTE — Patient Instructions (Addendum)
For the swelling in your lower extremities, be sure to elevate your legs when able, mind the salt intake, stay physically active and consider wearing compression stockings.  Give [place] 2-3 business days to get the results of your labs back.   Aim to do some physical exertion for 150 minutes per week. This is typically divided into 5 days per week, 30 minutes per day. The activity should be enough to get your heart rate up. Anything is better than nothing if you have time constraints.  We will be in touch regarding the scan of your leg.   Let us know if you need anything.  Knee Exercises It is normal to feel mild stretching, pulling, tightness, or discomfort as you do these exercises, but you should stop right away if you feel sudden pain or your pain gets worse. STRETCHING AND RANGE OF MOTION EXERCISES  These exercises warm up your muscles and joints and improve the movement and flexibility of your knee. These exercises also help to relieve pain, numbness, and tingling. Exercise A: Knee Extension, Prone  1. Lie on your abdomen on a bed. 2. Place your left / right knee just beyond the edge of the surface so your knee is not on the bed. You can put a towel under your left / right thigh just above your knee for comfort. 3. Relax your leg muscles and allow gravity to straighten your knee. You should feel a stretch behind your left / right knee. 4. Hold this position for 30 seconds. 5. Scoot up so your knee is supported between repetitions. Repeat 2 times. Complete this stretch 3 times per week. Exercise B: Knee Flexion, Active    1. Lie on your back with both knees straight. If this causes back discomfort, bend your left / right knee so your foot is flat on the floor. 2. Slowly slide your left / right heel back toward your buttocks until you feel a gentle stretch in the front of your knee or thigh. 3. Hold this position for 30 seconds. 4. Slowly slide your left / right heel back to the starting  position. Repeat 2 times. Complete this exercise 3 times per week. Exercise C: Quadriceps, Prone    1. Lie on your abdomen on a firm surface, such as a bed or padded floor. 2. Bend your left / right knee and hold your ankle. If you cannot reach your ankle or pant leg, loop a belt around your foot and grab the belt instead. 3. Gently pull your heel toward your buttocks. Your knee should not slide out to the side. You should feel a stretch in the front of your thigh and knee. 4. Hold this position for 30 seconds. Repeat 2 times. Complete this stretch 3 times per week. Exercise D: Hamstring, Supine  1. Lie on your back. 2. Loop a belt or towel over the ball of your left / right foot. The ball of your foot is on the walking surface, right under your toes. 3. Straighten your left / right knee and slowly pull on the belt to raise your leg until you feel a gentle stretch behind your knee. ? Do not let your left / right knee bend while you do this. ? Keep your other leg flat on the floor. 4. Hold this position for 30 seconds. Repeat 2 times. Complete this stretch 3 times per week. STRENGTHENING EXERCISES  These exercises build strength and endurance in your knee. Endurance is the ability to use your muscles  for a long time, even after they get tired. Exercise E: Quadriceps, Isometric    1. Lie on your back with your left / right leg extended and your other knee bent. Put a rolled towel or small pillow under your knee if told by your health care provider. 2. Slowly tense the muscles in the front of your left / right thigh. You should see your kneecap slide up toward your hip or see increased dimpling just above the knee. This motion will push the back of the knee toward the floor. 3. For 3 seconds, keep the muscle as tight as you can without increasing your pain. 4. Relax the muscles slowly and completely. Repeat for 10 total reps Repeat 2 ti mes. Complete this exercise 3 times per  week. Exercise F: Straight Leg Raises - Quadriceps  1. Lie on your back with your left / right leg extended and your other knee bent. 2. Tense the muscles in the front of your left / right thigh. You should see your kneecap slide up or see increased dimpling just above the knee. Your thigh may even shake a bit. 3. Keep these muscles tight as you raise your leg 4-6 inches (10-15 cm) off the floor. Do not let your knee bend. 4. Hold this position for 3 seconds. 5. Keep these muscles tense as you lower your leg. 6. Relax your muscles slowly and completely after each repetition. 10 total reps. Repeat 2 times. Complete this exercise 3 times per week.  Exercise G: Hamstring Curls    If told by your health care provider, do this exercise while wearing ankle weights. Begin with 5 lb weights (optional). Then increase the weight by 1 lb (0.5 kg) increments. Do not wear ankle weights that are more than 20 lbs to start with. 1. Lie on your abdomen with your legs straight. 2. Bend your left / right knee as far as you can without feeling pain. Keep your hips flat against the floor. 3. Hold this position for 3 seconds. 4. Slowly lower your leg to the starting position. Repeat for 10 reps.  Repeat 2 times. Complete this exercise 3 times per week. Exercise H: Squats (Quadriceps)  1. Stand in front of a table, with your feet and knees pointing straight ahead. You may rest your hands on the table for balance but not for support. 2. Slowly bend your knees and lower your hips like you are going to sit in a chair. ? Keep your weight over your heels, not over your toes. ? Keep your lower legs upright so they are parallel with the table legs. ? Do not let your hips go lower than your knees. ? Do not bend lower than told by your health care provider. ? If your knee pain increases, do not bend as low. 3. Hold the squat position for 1 second. 4. Slowly push with your legs to return to standing. Do not use your  hands to pull yourself to standing. Repeat 2 times. Complete this exercise 3 times per week. Exercise I: Wall Slides (Quadriceps)    1. Lean your back against a smooth wall or door while you walk your feet out 18-24 inches (46-61 cm) from it. 2. Place your feet hip-width apart. 3. Slowly slide down the wall or door until your knees Repeat 2 times. Complete this exercise every other day. 4. Exercise K: Straight Leg Raises - Hip Abductors  1. Lie on your side with your left / right leg in the  top position. Lie so your head, shoulder, knee, and hip line up. You may bend your bottom knee to help you keep your balance. 2. Roll your hips slightly forward so your hips are stacked directly over each other and your left / right knee is facing forward. 3. Leading with your heel, lift your top leg 4-6 inches (10-15 cm). You should feel the muscles in your outer hip lifting. ? Do not let your foot drift forward. ? Do not let your knee roll toward the ceiling. 4. Hold this position for 3 seconds. 5. Slowly return your leg to the starting position. 6. Let your muscles relax completely after each repetition. 10 total reps. Repeat 2 times. Complete this exercise 3 times per week. Exercise J: Straight Leg Raises - Hip Extensors  1. Lie on your abdomen on a firm surface. You can put a pillow under your hips if that is more comfortable. 2. Tense the muscles in your buttocks and lift your left / right leg about 4-6 inches (10-15 cm). Keep your knee straight as you lift your leg. 3. Hold this position for 3 seconds. 4. Slowly lower your leg to the starting position. 5. Let your leg relax completely after each repetition. Repeat 2 times. Complete this exercise 3 times per week. Document Released: 10/06/2005 Document Revised: 08/16/2016 Document Reviewed: 09/28/2015 Elsevier Interactive Patient Education  2017 [employer].

## 2021-07-04 IMAGING — US US EXTREM LOW VENOUS*L*
1 series · 13 of 24 positions shown · non-contrast
Comparison: None.

CLINICAL DATA: Bilateral lower extremity edema, left greater than
right.



[Series 1: us extrem low venous*left* · 13 of 41 slices shown]
[im 1/41]
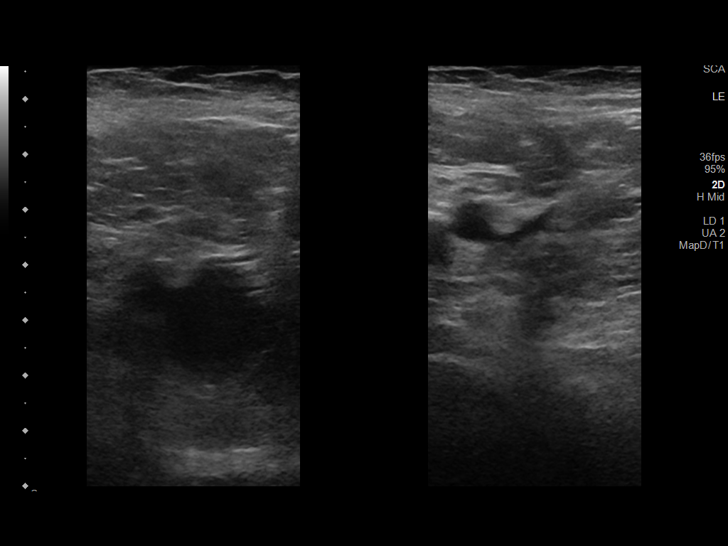
[im 4/41]
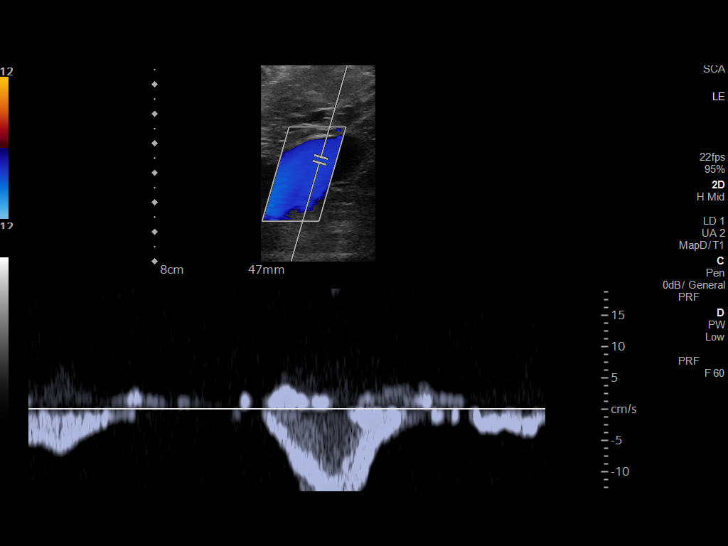
[im 7/41]
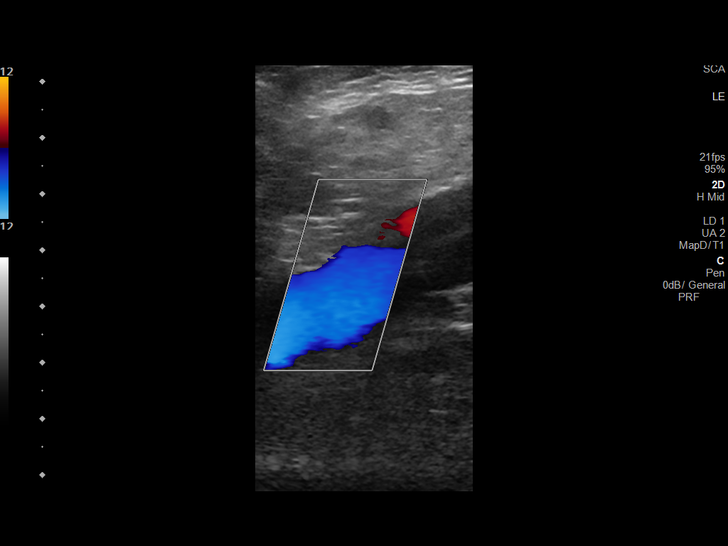
[im 11/41]
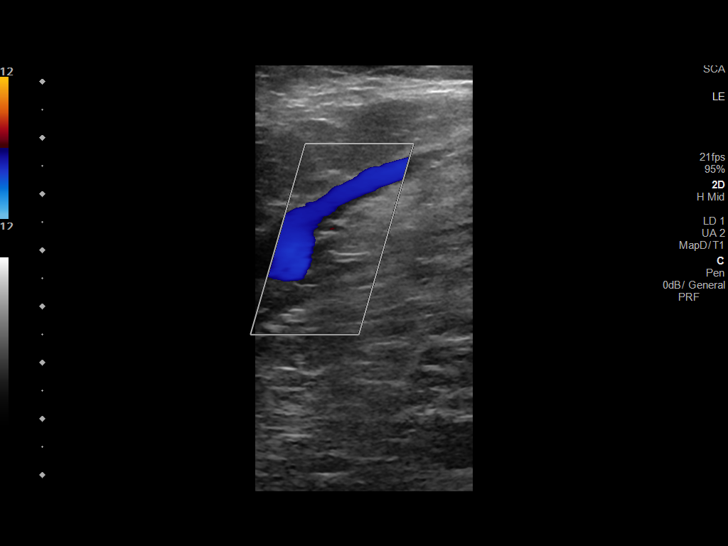
[im 14/41]
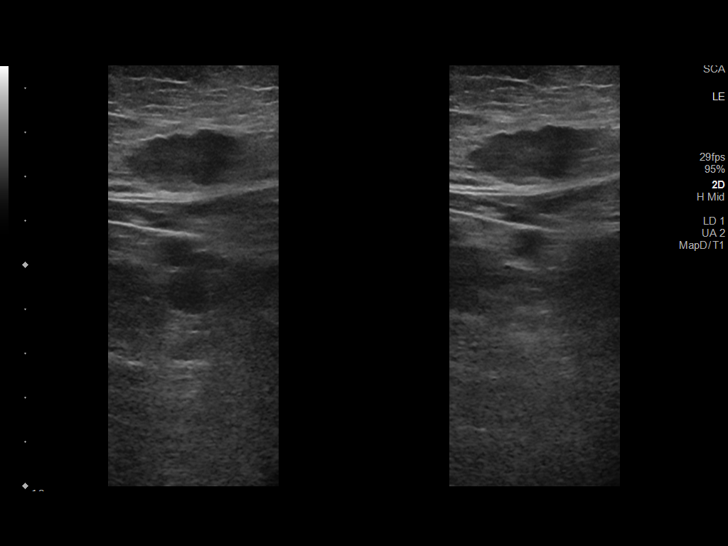
[im 18/41]
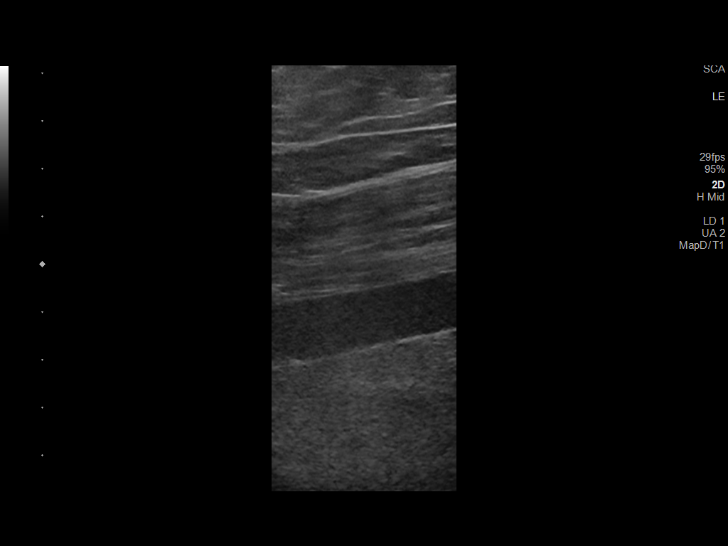
[im 21/41]
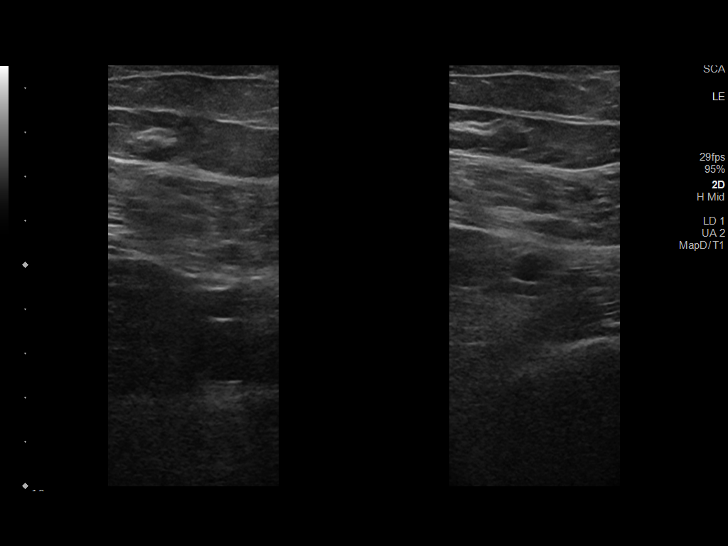
[im 23/41]
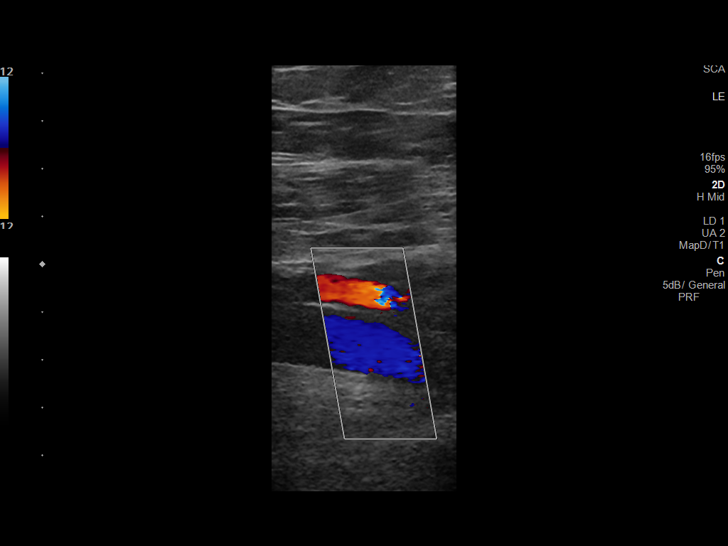
[im 27/41]
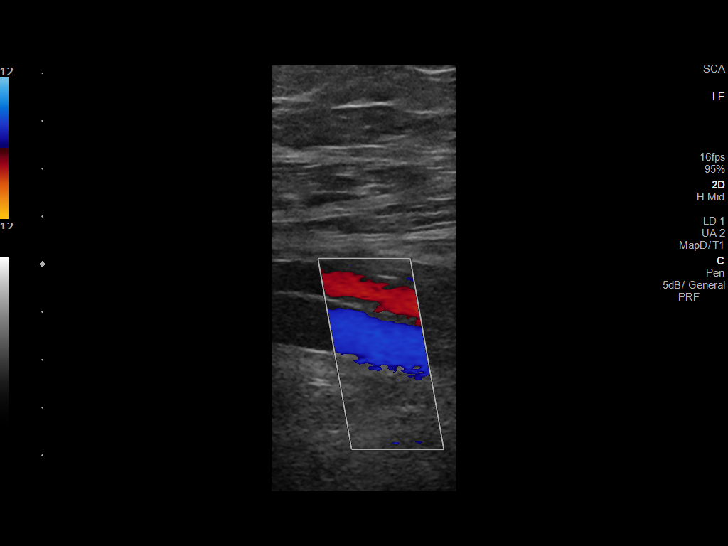
[im 30/41]
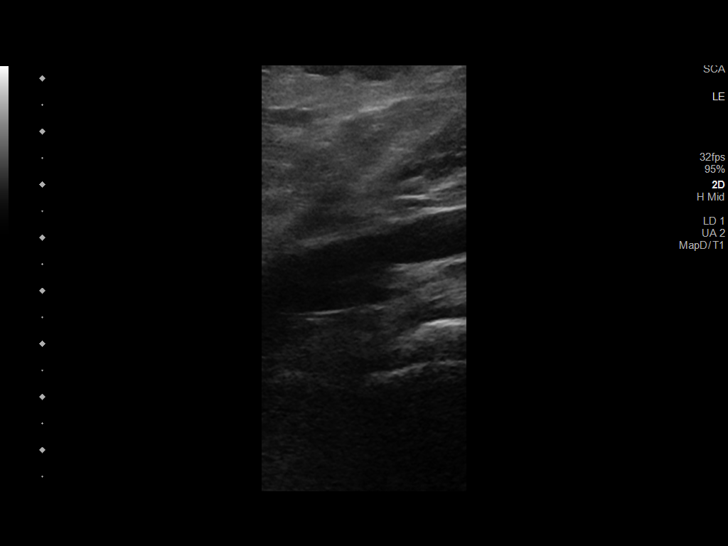
[im 34/41]
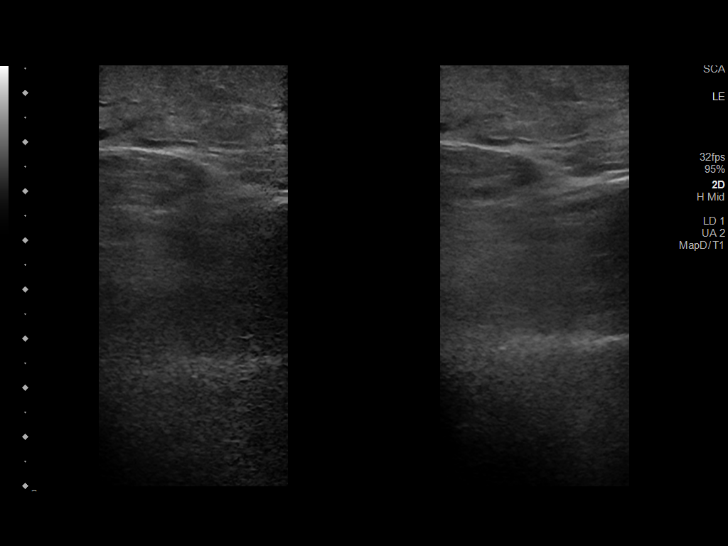
[im 37/41]
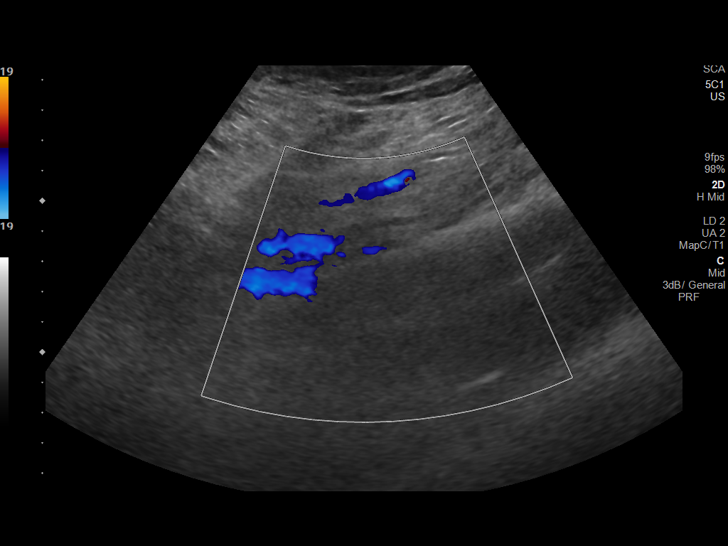
[im 41/41]
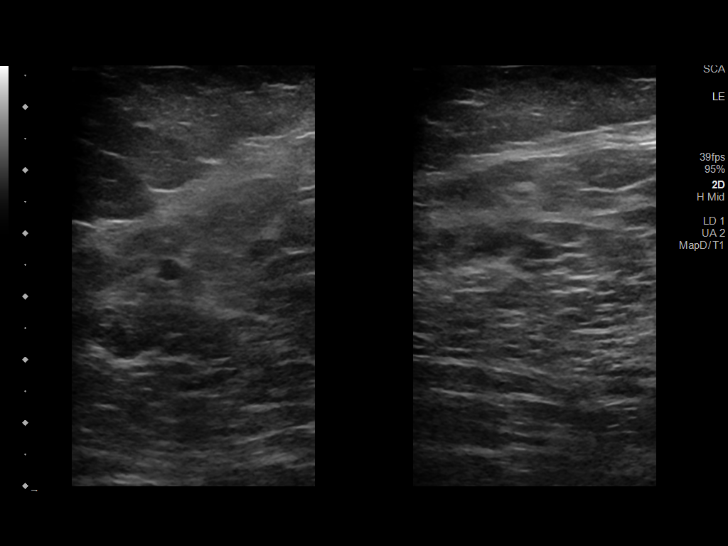

[13 of 24 positions shown; findings below may reference images not displayed]

FINDINGS: RIGHT LOWER EXTREMITY

Common Femoral Vein: No evidence of thrombus. Normal
compressibility, respiratory phasicity and response to augmentation.

Saphenofemoral Junction: No evidence of thrombus. Normal
compressibility and flow on color Doppler imaging.

Profunda Femoral Vein: No evidence of thrombus. Normal
compressibility and flow on color Doppler imaging.

Femoral Vein: No evidence of thrombus. Normal compressibility,
respiratory phasicity and response to augmentation.

Popliteal Vein: No evidence of thrombus. Normal compressibility,
respiratory phasicity and response to augmentation.

Calf Veins: No evidence of thrombus. Normal compressibility and flow
on color Doppler imaging.

Superficial Great Saphenous Vein: No evidence of thrombus. Normal
compressibility.

Venous Reflux:  None.

Other Findings: No evidence of superficial thrombophlebitis or
abnormal fluid collection.

LEFT LOWER EXTREMITY

Common Femoral Vein: No evidence of thrombus. Normal
compressibility, respiratory phasicity and response to augmentation.

Saphenofemoral Junction: No evidence of thrombus. Normal
compressibility and flow on color Doppler imaging.

Profunda Femoral Vein: No evidence of thrombus. Normal
compressibility and flow on color Doppler imaging.

Femoral Vein: No evidence of thrombus. Normal compressibility,
respiratory phasicity and response to augmentation.

Popliteal Vein: No evidence of thrombus. Normal compressibility,
respiratory phasicity and response to augmentation.

Calf Veins: No evidence of thrombus. Normal compressibility and flow
on color Doppler imaging.

Superficial Great Saphenous Vein: No evidence of thrombus. Normal
compressibility.

Venous Reflux:  None.

Other Findings: No evidence of superficial thrombophlebitis or
abnormal fluid collection.
IMPRESSION: No evidence of deep venous thrombosis in either lower extremity.

## 2021-09-07 ENCOUNTER — Ambulatory Visit: Payer: BC Managed Care – PPO | Admitting: Family

## 2021-09-07 ENCOUNTER — Other Ambulatory Visit: Payer: Self-pay

## 2021-09-07 VITALS — BP 121/86 | HR 81 | Temp 98.7°F | Resp 16 | Ht 71.0 in | Wt 332.0 lb

## 2021-09-07 DIAGNOSIS — I10 Essential (primary) hypertension: Secondary | ICD-10-CM | POA: Diagnosis not present

## 2021-09-07 DIAGNOSIS — R609 Edema, unspecified: Secondary | ICD-10-CM | POA: Insufficient documentation

## 2021-09-07 DIAGNOSIS — R000 Tachycardia, unspecified: Secondary | ICD-10-CM

## 2021-09-07 DIAGNOSIS — I472 Ventricular tachycardia, unspecified: Secondary | ICD-10-CM | POA: Insufficient documentation

## 2021-09-07 LAB — CBC WITH DIFFERENTIAL/PLATELET
Basophils Absolute: 0.1 10*3/uL (ref 0.0–0.1)
Basophils Relative: 1.3 % (ref 0.0–3.0)
Eosinophils Absolute: 0.2 10*3/uL (ref 0.0–0.7)
Eosinophils Relative: 3.3 % (ref 0.0–5.0)
HCT: 40.1 % (ref 39.0–52.0)
Hemoglobin: 13.3 g/dL (ref 13.0–17.0)
Lymphocytes Relative: 38.2 % (ref 12.0–46.0)
Lymphs Abs: 2.4 10*3/uL (ref 0.7–4.0)
MCHC: 33.2 g/dL (ref 30.0–36.0)
MCV: 86.2 fl (ref 78.0–100.0)
Monocytes Absolute: 0.8 10*3/uL (ref 0.1–1.0)
Monocytes Relative: 12 % (ref 3.0–12.0)
Neutro Abs: 2.8 10*3/uL (ref 1.4–7.7)
Neutrophils Relative %: 45.2 % (ref 43.0–77.0)
Platelets: 278 10*3/uL (ref 150.0–400.0)
RBC: 4.65 Mil/uL (ref 4.22–5.81)
RDW: 12.9 % (ref 11.5–15.5)
WBC: 6.3 10*3/uL (ref 4.0–10.5)

## 2021-09-07 LAB — COMPREHENSIVE METABOLIC PANEL
ALT: 29 U/L (ref 0–53)
AST: 17 U/L (ref 0–37)
Albumin: 4.1 g/dL (ref 3.5–5.2)
Alkaline Phosphatase: 107 U/L (ref 39–117)
BUN: 17 mg/dL (ref 6–23)
CO2: 28 mEq/L (ref 19–32)
Calcium: 9.3 mg/dL (ref 8.4–10.5)
Chloride: 101 mEq/L (ref 96–112)
Creatinine, Ser: 1.03 mg/dL (ref 0.40–1.50)
GFR: 83.21 mL/min (ref >60.00)
Glucose, Bld: 144 mg/dL — ABNORMAL HIGH (ref 70–99)
Potassium: 4.4 mEq/L (ref 3.5–5.1)
Sodium: 138 mEq/L (ref 135–145)
Total Bilirubin: 0.4 mg/dL (ref 0.2–1.2)
Total Protein: 6.5 g/dL (ref 6.0–8.3)

## 2021-09-07 LAB — TSH: TSH: 1.45 u[IU]/mL (ref 0.35–5.50)

## 2021-09-07 NOTE — Assessment & Plan Note (Deleted)
Wt Readings from Last 3 Encounters:  09/07/21 (!) 332 lb (150.6 kg)  01/27/21 (!) 322 lb 4 oz (146.2 kg)  11/21/20 (!) 307 lb 14.4 oz (139.7 kg)

## 2021-09-07 NOTE — Assessment & Plan Note (Signed)
New.  EKG tracing is personally reviewed. (occasional PAC's) EKG notes NSR.  No acute changes.

## 2021-09-07 NOTE — Patient Instructions (Signed)
Please complete lab work prior to leaving.  Stop ibuprofen, you may use tylenol as needed. You should be contacted about your referrals to cardiology and vascular surgeon.

## 2021-09-07 NOTE — Assessment & Plan Note (Signed)
BP Readings from Last 3 Encounters:  09/07/21 121/86  01/27/21 130/86  11/21/20 (!) 143/86   BP is stable off of meds.  Continue to monitor.

## 2021-09-07 NOTE — Assessment & Plan Note (Signed)
Suspect that this is related to venous insufficiency. He had a negative [place] previously and has had vein surgery bilaterally. Would like to return to his vein specialist. Referral placed.

## 2021-09-07 NOTE — Progress Notes (Signed)
Subjective:   By signing my name below, I, Shehryar Baig, attest that this documentation has been prepared under the direction and in the presence of Debbrah Alar NP. 09/07/2021    Patient ID: Angel Merritt, male    DOB: 06/06/1966, 55 y.o.   MRN: [id number]  Chief Complaint  Patient presents with   Annual Exam    HPI Patient is in today for a office visit.  Leg swelling- He reports that his left leg continues to well. He notes that the swelling is from his knee to his lower ankle. He has numbness around his knee. He has tried compression socks but found no change. He had an MRI on his left leg and results were normal. He has a history of varicose veins surgery on but his lower legs.   He also complains of left knee pain after working the whole day.  Blood in stool- He reports since before and after his last colonoscopy he has blood in stool and blood while wiping. His last colonoscopy showed internal hemorrhoids.  Elevated heart rate- He reports his heart rate occasionally starts racing while working. He notes that it elevates around to 171 bpm. Colonoscopy: Last completed 11/07/2020. Results show diverticulosis in the sigmoid colon, non-bleeding internal hemorrhoids, otherwise results are normal. Repeat in 5 years.    Health Maintenance Due  Topic Date Due   URINE MICROALBUMIN  Never done   COVID-19 Vaccine (3 - Booster for Pfizer series) 02/17/2021   INFLUENZA VACCINE  Never done    Past Medical History:  Diagnosis Date   B12 deficiency 09/07/2020   ED (erectile dysfunction)    GERD (gastroesophageal reflux disease)    Hyperlipidemia    OA (osteoarthritis)    Umbilical hernia    Varicose veins    Wears glasses     Past Surgical History:  Procedure Laterality Date   COLONOSCOPY WITH PROPOFOL  11-07-2020  dr nandigam   ENDOVENOUS ABLATION SAPHENOUS VEIN W/ LASER Left 11-06-2015   endovenous laser ablation left greater saphenous vein and stab phlebectomies > 20  incisions  left leg  by Curt Jews MD   UMBILICAL HERNIA REPAIR N/A 11/21/2020   Procedure: LAPAROSCOPIC UMBILICAL HERNIA WITH MESH;  Surgeon: Ralene Ok, MD;  Location: [facility];  Service: General;  Laterality: N/A;    Family History  Problem Relation Age of Onset   Colon cancer Father 37       died at 29 (following a hernia repair)   Cancer Father    Coronary artery disease Mother 29       (has 3 stents)   Heart disease Mother    Cancer Paternal Grandfather        brain tumor   Coronary artery disease Maternal Grandmother 60       smoker   Esophageal cancer Neg Hx    Stomach cancer Neg Hx    Rectal cancer Neg Hx     Social History   Socioeconomic History   Marital status: Married    Spouse name: Not on file   Number of children: Not on file   Years of education: Not on file   Highest education level: Not on file  Occupational History   Not on file  Tobacco Use   Smoking status: Never   Smokeless tobacco: Never  Vaping Use   Vaping Use: Never used  Substance and Sexual Activity   Alcohol use: Not Currently   Drug use: Never   Sexual activity:  Not on file    Comment: vasectomy  2017  Other Topics Concern   Not on file  Social History Narrative   4 children- 20 (daughter), 18 (daughter), 8 son, 5 son   Works as Dealer   Married   Enjoys baseball, golf, gym   Social Determinants of Radio broadcast assistant Strain: Not on Art therapist Insecurity: Not on file  Transportation Needs: Not on file  Physical Activity: Not on file  Stress: Not on file  Social Connections: Not on file  Intimate Partner Violence: Not on file    Outpatient Medications Prior to Visit  Medication Sig Dispense Refill   famotidine (PEPCID) 10 MG tablet Take 10 mg by mouth as needed for heartburn or indigestion.     ibuprofen (ADVIL,MOTRIN) 200 MG tablet Take 200 mg by mouth as needed.      betamethasone valerate ointment (VALISONE) 0.1 % Apply 1 application  topically 2 (two) times daily. 30 g 0   sildenafil (REVATIO) 20 MG tablet Take 1-2 tablets by mouth prior to sexual activity 50 tablet 0   traMADol (ULTRAM) 50 MG tablet Take 1 tablet (50 mg total) by mouth every 6 (six) hours as needed. 20 tablet 0   vitamin B-12 (CYANOCOBALAMIN) 1000 MCG tablet Take 1 tablet (1,000 mcg total) by mouth daily.     No facility-administered medications prior to visit.    Allergies  Allergen Reactions   Effexor [Venlafaxine] Other (See Comments)    Severe daytime somnolence, head ache and weight gain   Lexapro [Escitalopram Oxalate]     Weight gain    Review of Systems  Constitutional:        (-)unexpected weight change (-)Adenopathy  HENT:         (-)Rhinorrhea   Eyes:        (-)Visual disturbance  Cardiovascular:  Positive for leg swelling (Left leg).       (+)occasional tachycardia  Gastrointestinal:  Positive for blood in stool.  Musculoskeletal:  Positive for joint pain (Left knee).  Neurological:  Positive for tingling (Below left knee).      Objective:    Physical Exam Constitutional:      General: He is not in acute distress.    Appearance: Normal appearance. He is not ill-appearing.  HENT:     Head: Normocephalic and atraumatic.     Right Ear: External ear normal.     Left Ear: External ear normal.  Eyes:     Extraocular Movements: Extraocular movements intact.     Pupils: Pupils are equal, round, and reactive to light.  Neck:     Thyroid: No thyromegaly or thyroid tenderness.  Cardiovascular:     Rate and Rhythm: Normal rate and regular rhythm.     Heart sounds: Normal heart sounds. No murmur heard.   No gallop.  Pulmonary:     Effort: Pulmonary effort is normal. No respiratory distress.     Breath sounds: Normal breath sounds. No wheezing or rales.  Musculoskeletal:     Right lower leg: 1+ Edema present.     Left lower leg: 2+ Edema present.  Skin:    General: Skin is warm and dry.  Neurological:     Mental Status:  He is alert and oriented to person, place, and time.     Deep Tendon Reflexes:     Reflex Scores:      Patellar reflexes are 2+ on the right side and 2+ on the left side. Psychiatric:  Behavior: Behavior normal.        Judgment: Judgment normal.    BP 121/86 (BP Location: Left Arm, Patient Position: Sitting, Cuff Size: Large)   Pulse 81   Temp 98.7 F (37.1 C) (Oral)   Resp 16   Ht _0  (1.803 m)   Wt (!) 332 lb (150.6 kg)   SpO2 97%   BMI 46.30 kg/m  Wt Readings from Last 3 Encounters:  09/07/21 (!) 332 lb (150.6 kg)  01/27/21 (!) 322 lb 4 oz (146.2 kg)  11/21/20 (!) 307 lb 14.4 oz (139.7 kg)       Assessment & Plan:   Problem List Items Addressed This Visit       Unprioritized   Tachycardia    New.  EKG tracing is personally reviewed. (occasional PAC's) EKG notes NSR.  No acute changes.        Relevant Orders   Comp Met (CMET)   TSH   CBC with Differential/Platelet   Ambulatory referral to Cardiology   EKG 12-Lead (Completed)   Essential hypertension    BP Readings from Last 3 Encounters:  09/07/21 121/86  01/27/21 130/86  11/21/20 (!) 143/86  BP is stable off of meds.  Continue to monitor.       Edema - Primary    Suspect that this is related to venous insufficiency. He had a negative [place] previously and has had vein surgery bilaterally. Would like to return to his vein specialist. Referral placed.       Relevant Orders   Ambulatory referral to Vascular Surgery     No orders of the defined types were placed in this encounter.   I, Debbrah Alar NP, personally preformed the services described in this documentation.  All medical record entries made by the scribe were at my direction and in my presence.  I have reviewed the chart and discharge instructions (if applicable) and agree that the record reflects my personal performance and is accurate and complete. 09/07/2021   I,Shehryar Baig,acting as a Education administrator for Nance Pear, NP.,have  documented all relevant documentation on the behalf of Nance Pear, NP,as directed by  Nance Pear, NP while in the presence of Nance Pear, NP.   Nance Pear, NP

## 2021-09-07 NOTE — Assessment & Plan Note (Signed)
Suspect that his left knee pain is due to arthritis. He has been taking ibuprofen daily. Advised pt to change to tylenol as needed.  Also discussed weight loss.

## 2021-09-09 ENCOUNTER — Telehealth: Payer: Self-pay | Admitting: Family

## 2021-09-09 ENCOUNTER — Other Ambulatory Visit (INDEPENDENT_AMBULATORY_CARE_PROVIDER_SITE_OTHER): Payer: BC Managed Care – PPO

## 2021-09-09 ENCOUNTER — Encounter: Payer: Self-pay | Admitting: Family

## 2021-09-09 DIAGNOSIS — E119 Type 2 diabetes mellitus without complications: Secondary | ICD-10-CM | POA: Insufficient documentation

## 2021-09-09 DIAGNOSIS — E1169 Type 2 diabetes mellitus with other specified complication: Secondary | ICD-10-CM | POA: Insufficient documentation

## 2021-09-09 DIAGNOSIS — R739 Hyperglycemia, unspecified: Secondary | ICD-10-CM

## 2021-09-09 DIAGNOSIS — E669 Obesity, unspecified: Secondary | ICD-10-CM | POA: Insufficient documentation

## 2021-09-09 HISTORY — DX: Type 2 diabetes mellitus without complications: E11.9

## 2021-09-09 LAB — HEMOGLOBIN A1C: Hgb A1c MFr Bld: 7.5 % — ABNORMAL HIGH (ref 4.6–6.5)

## 2021-09-09 NOTE — Telephone Encounter (Signed)
Please advise pt that his lab work show that he is diabetic.  I would recommend that he work on exercise, weight loss, limiting carbs/sugars in his diet. Please follow up in 3 months.

## 2021-09-10 NOTE — Telephone Encounter (Signed)
Patient advised of results and provider's advise. He verbalized understanding and will follow up on his scheduled appointment for a physical next month.

## 2021-10-05 ENCOUNTER — Other Ambulatory Visit: Payer: Self-pay | Admitting: *Deleted

## 2021-10-05 DIAGNOSIS — I8390 Asymptomatic varicose veins of unspecified lower extremity: Secondary | ICD-10-CM

## 2021-10-08 ENCOUNTER — Encounter: Payer: BC Managed Care – PPO | Admitting: Vascular Surgery

## 2021-10-08 ENCOUNTER — Other Ambulatory Visit: Payer: Self-pay

## 2021-10-08 ENCOUNTER — Ambulatory Visit (HOSPITAL_COMMUNITY)
Admission: RE | Admit: 2021-10-08 | Discharge: 2021-10-08 | Disposition: A | Discharge status: Home / Self Care | Payer: BC Managed Care – PPO | Source: Ambulatory Visit | Attending: Vascular Surgery | Admitting: Vascular Surgery

## 2021-10-08 DIAGNOSIS — I8392 Asymptomatic varicose veins of left lower extremity: Secondary | ICD-10-CM | POA: Diagnosis not present

## 2021-10-08 DIAGNOSIS — I8390 Asymptomatic varicose veins of unspecified lower extremity: Secondary | ICD-10-CM | POA: Insufficient documentation

## 2021-10-13 DIAGNOSIS — N529 Male erectile dysfunction, unspecified: Secondary | ICD-10-CM | POA: Insufficient documentation

## 2021-10-13 DIAGNOSIS — Z973 Presence of spectacles and contact lenses: Secondary | ICD-10-CM | POA: Insufficient documentation

## 2021-10-15 ENCOUNTER — Encounter: Payer: Self-pay | Admitting: Cardiology

## 2021-10-15 ENCOUNTER — Ambulatory Visit: Payer: BC Managed Care – PPO | Admitting: Cardiology

## 2021-10-15 ENCOUNTER — Ambulatory Visit (INDEPENDENT_AMBULATORY_CARE_PROVIDER_SITE_OTHER): Payer: BC Managed Care – PPO

## 2021-10-15 ENCOUNTER — Other Ambulatory Visit: Payer: Self-pay

## 2021-10-15 VITALS — BP 122/88 | HR 77 | Ht 72.0 in | Wt 331.0 lb

## 2021-10-15 DIAGNOSIS — E119 Type 2 diabetes mellitus without complications: Secondary | ICD-10-CM | POA: Diagnosis not present

## 2021-10-15 DIAGNOSIS — G4733 Obstructive sleep apnea (adult) (pediatric): Secondary | ICD-10-CM | POA: Insufficient documentation

## 2021-10-15 DIAGNOSIS — E785 Hyperlipidemia, unspecified: Secondary | ICD-10-CM

## 2021-10-15 DIAGNOSIS — G4719 Other hypersomnia: Secondary | ICD-10-CM | POA: Diagnosis not present

## 2021-10-15 DIAGNOSIS — R002 Palpitations: Secondary | ICD-10-CM

## 2021-10-15 HISTORY — DX: Obstructive sleep apnea (adult) (pediatric): G47.33

## 2021-10-15 MED ORDER — ROSUVASTATIN CALCIUM 10 MG PO TABS: 10.0000 mg | ORAL_TABLET | Freq: Every day | ORAL | 1 refills | Status: DC

## 2021-10-15 MED ORDER — ATORVASTATIN CALCIUM 10 MG PO TABS: 10.0000 mg | ORAL_TABLET | Freq: Every day | ORAL | 1 refills | Status: DC

## 2021-10-15 NOTE — Patient Instructions (Addendum)
Medication Instructions:  Your physician has recommended you make the following change in your medication:  START: crestor 10 mg daily   *If you need a refill on your cardiac medications before your next appointment, please call your pharmacy*   Lab Work: Your physician recommends that you return for lab work in 6 weeks: lipid, lft  If you have labs (blood work) drawn today and your tests are completely normal, you will receive your results only by: MyChart Message (if you have MyChart) OR A paper copy in the mail If you have any lab test that is abnormal or we need to change your treatment, we will call you to review the results.   Testing/Procedures: A zio monitor was ordered today. It will remain on for 14 days. You will then return monitor and event diary in provided box. It takes 1-2 weeks for report to be downloaded and returned to [place]. We will call you with the results. If monitor falls off or has orange flashing light, please call Zio for further instructions.   Your physician has recommended that you have a sleep study. This test records several body functions during sleep, including: brain activity, eye movement, oxygen and carbon dioxide blood levels, heart rate and rhythm, breathing rate and rhythm, the flow of air through your mouth and nose, snoring, body muscle movements, and chest and belly movement.    Follow-Up: At [facility], you and your health needs are our priority.  As part of our continuing mission to provide you with exceptional heart care, we have created designated Provider Care Teams.  These Care Teams include your primary Cardiologist (physician) and Advanced Practice Providers (APPs -  Physician Assistants and Nurse Practitioners) who all work together to provide you with the care you need, when you need it.  We recommend signing up for the patient portal called "MyChart".  Sign up information is provided on this After Visit Summary.  MyChart is used to  connect with patients for Virtual Visits (Telemedicine).  Patients are able to view lab/test results, encounter notes, upcoming appointments, etc.  Non-urgent messages can be sent to your provider as well.   To learn more about what you can do with MyChart, go to [email or web address].    Your next appointment:   2 month(s)  The format for your next appointment:   In Person  Provider:   Gypsy Balsam, MD     Other Instructions    Sleep Study, Adult A sleep study (polysomnogram) is a series of tests done while you are sleeping. A sleep study records your brain waves, heart rate, breathing rate, oxygen level, and eye and leg movements. A sleep study helps your health care provider: See how well you sleep. Diagnose a sleep disorder. Determine how severe your sleep disorder is. Create a plan to treat your sleep disorder. Your health care provider may recommend a sleep study if you: Feel sleepy on most days. Snore loudly while sleeping. Have unusual behaviors while you sleep, such as walking. Have brief periods in which you stop breathing during sleep (sleepapnea). Fall asleep suddenly during the day (narcolepsy). Have trouble falling asleep or staying asleep (insomnia). Feel like you need to move your legs when trying to fall asleep (restless legs syndrome). Move your legs by flexing and extending them regularly while asleep (periodic limb movement disorder). Act out your dreams while you sleep (sleep behavior disorder). Feel like you cannot move when you first wake up (sleep paralysis). What tests are part of  a sleep study? Most sleep studies record the following during sleep: Brain activity. Eye movements. Heart rate and rhythm. Breathing rate and rhythm. Blood-oxygen level. Blood pressure. Chest and belly movement as you breathe. Arm and leg movements. Snoring or other noises. Body position. Where are sleep studies done? Sleep studies are done at sleep centers. A  sleep center may be inside a hospital, office, or clinic. The room where you have the study may look like a hospital room or a hotel room. The health care providers doing the study may come in and out of the room during the study. Most of the time, they will be in another room monitoring your test as you sleep. How are sleep studies done? Most sleep studies are done during a normal period of time for a full night of sleep. You will arrive at the study center in the evening and go home in the morning. Before the test Bring your pajamas and toothbrush with you to the sleep study. Do not have caffeine on the day of your sleep study. Do not drink alcohol on the day of your sleep study. Your health care provider will let you know if you should stop taking any of your regular medicines before the test. During the test   Round, sticky patches with sensors attached to recording wires (electrodes) are placed on your scalp, face, chest, and limbs. Wires from all the electrodes and sensors run from your bed to a computer. The wires can be taken off and put back on if you need to get out of bed to go to the bathroom. A sensor is placed over your nose to measure airflow. A finger clip is put on your finger or ear to measure your blood oxygen level (pulse oximetry). A belt is placed around your belly and a belt is placed around your chest to measure breathing movements. If you have signs of the sleep disorder called sleep apnea during your test, you may get a treatment mask to wear for the second half of the night. The mask provides positive airway pressure (PAP) to help you breathe better during sleep. This may greatly improve your sleep apnea. You will then have all tests done again with the mask in place to see if your measurements and recordings change. After the test A medical doctor who specializes in sleep will evaluate the results of your sleep study and share them with you and your primary health care  provider. Based on your results, your medical history, and a physical exam, you may be diagnosed with a sleep disorder, such as: Sleep apnea. Restless legs syndrome. Sleep-related behavior disorder. Sleep-related movement disorders. Sleep-related seizure disorders. Your health care team will help determine your treatment options based on your diagnosis. This may include: Improving your sleep habits (sleep hygiene). Wearing a continuous positive airway pressure (CPAP) or bi-level positive airway pressure (BPAP) mask. Wearing an oral device at night to improve breathing and reduce snoring. Taking medicines. Follow these instructions at home: Take over-the-counter and prescription medicines only as told by your health care provider. If you are instructed to use a CPAP or BPAP mask, make sure you use it nightly as directed. Make any lifestyle changes that your health care provider recommends. If you were given a device to open your airway while you sleep, use it only as told by your health care provider. Do not use any tobacco products, such as cigarettes, chewing tobacco, and e-cigarettes. If you need help quitting, ask your health  care provider. Keep all follow-up visits as told by your health care provider. This is important. Summary A sleep study (polysomnogram) is a series of tests done while you are sleeping. It shows how well you sleep. Most sleep studies are done over one full night of sleep. You will arrive at the study center in the evening and go home in the morning. If you have signs of the sleep disorder called sleep apnea during your test, you may get a treatment mask to wear for the second half of the night. A medical doctor who specializes in sleep will evaluate the results of your sleep study and share them with your primary health care provider. This information is not intended to replace advice given to you by your health care provider. Make sure you discuss any questions you  have with your health care provider. Document Revised: 07/01/2021 Document Reviewed: 12/20/2017 Elsevier Patient Education  2022 [employer].

## 2021-10-15 NOTE — Progress Notes (Signed)
Cardiology Consultation:    Date:  10/15/2021   ID:  Angel Merritt, DOB September 13, 1966, MRN [id number]  PCP:  Sandford Craze, NP  Cardiologist:  Gypsy Balsam, MD   Referring MD: Sandford Craze, NP   Chief Complaint  Patient presents with   Tachycardia    Ongoing for a month   Shortness of Breath         History of Present Illness:    Angel Merritt is a 55 y.o. male who is being seen today for the evaluation of tachycardia at the request of Sandford Craze, NP. Patient presents for further evaluation of tachycardia and palpitations that first started a month ago. Never experienced this prior to a month, endorsing 4 episodes of increased heart rate and palpitations only when he is exerting himself. Endorses dyspnea on exertion at these times. These symptoms typically spontaneously resolve within a few seconds. 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 Also endorsing leg swelling which he has experienced for the past year since he received his COVID vaccine. He has tried compression socks as well as elevated legs when he is at rest but shares that they just resume swelling shortly after doing these things. Drinks an ample amount of coffee and sodas throughout the day but this is not new in the past month. Does not eat a balanced diet. Mother had an MI with stent placement at the age of 56, currently she is retired and doing well now.   Past Medical History:  Diagnosis Date   B12 deficiency 09/07/2020   Diabetes mellitus type 2, controlled (HCC) 09/09/2021   ED (erectile dysfunction)    GERD (gastroesophageal reflux disease)    Hyperlipidemia    OA (osteoarthritis)    Umbilical hernia    Varicose veins    Wears glasses     Past Surgical History:  Procedure Laterality Date   COLONOSCOPY WITH PROPOFOL  11-07-2020  dr nandigam   ENDOVENOUS ABLATION SAPHENOUS VEIN W/ LASER Left 11-06-2015    endovenous laser ablation left greater saphenous vein and stab phlebectomies > 20 incisions  left leg  by Gretta Began MD   UMBILICAL HERNIA REPAIR N/A 11/21/2020   Procedure: LAPAROSCOPIC UMBILICAL HERNIA WITH MESH;  Surgeon: Axel Filler, MD;  Location: [facility];  Service: General;  Laterality: N/A;    Current Medications: Current Meds  Medication Sig   famotidine (PEPCID) 10 MG tablet Take 10 mg by mouth as needed for heartburn or indigestion.   ibuprofen (ADVIL,MOTRIN) 200 MG tablet Take 200 mg by mouth as needed for headache, mild pain or moderate pain.     Allergies:   Effexor [venlafaxine] and Lexapro [escitalopram oxalate]   Social History   Socioeconomic History   Marital status: Married    Spouse name: Not on file   Number of children: Not on file   Years of education: Not on file   Highest education level: Not on file  Occupational History   Not on file  Tobacco Use   Smoking status: Never   Smokeless tobacco: Never  Vaping Use   Vaping Use: Never used  Substance and Sexual Activity   Alcohol use: Not Currently   Drug use: Never   Sexual activity: Not on file    Comment: vasectomy  2017  Other Topics Concern   Not on file  Social History Narrative   4 children- 2 (daughter), 34 (daughter), 8 son, 5 son  Works as Curator   Married   Enjoys baseball, golf, gym   Social Determinants of Corporate investment banker Strain: Not on [employer] Insecurity: Not on file  Transportation Needs: Not on file  Physical Activity: Not on file  Stress: Not on file  Social Connections: Not on file     Family History: The patient's family history includes Cancer in his father and paternal grandfather; Colon cancer (age of onset: 80) in his father; Coronary artery disease (age of onset: 55) in his maternal grandmother and mother; Heart disease in his mother. There is no history of Esophageal cancer, Stomach cancer, or Rectal cancer. ROS:   Please see  the history of present illness.    All 14 point review of systems negative except as described per history of present illness.  EKGs/Labs/Other Studies Reviewed:    The following studies were reviewed today:   EKG:  EKG is  ordered today.  The ekg ordered today demonstrates   Recent Labs: 09/07/2021: ALT 29; BUN 17; Creatinine, Ser 1.03; Hemoglobin 13.3; Platelets 278.0; Potassium 4.4; Sodium 138; TSH 1.45  Recent Lipid Panel    Component Value Date/Time   CHOL 207 (H) 09/05/2020 0801   TRIG 177 (H) 09/05/2020 0801   HDL 42 09/05/2020 0801   CHOLHDL 4.9 09/05/2020 0801   VLDL 37.0 09/04/2019 0720   LDLCALC 134 (H) 09/05/2020 0801    Physical Exam:    VS:  BP 122/88 (BP Location: Right Arm, Patient Position: Sitting)   Pulse 77   Ht 6' (1.829 m)   Wt (!) 331 lb (150.1 kg)   SpO2 98%   BMI 44.89 kg/m     Wt Readings from Last 3 Encounters:  10/15/21 (!) 331 lb (150.1 kg)  09/07/21 (!) 332 lb (150.6 kg)  01/27/21 (!) 322 lb 4 oz (146.2 kg)     GEN:  Well nourished, well developed in no acute distress HEENT: Normal NECK: No JVD; No carotid bruits LYMPHATICS: No lymphadenopathy CARDIAC: RRR, no murmurs, no rubs, no gallops RESPIRATORY:  Clear to auscultation without rales, wheezing or rhonchi  ABDOMEN: Soft, non-tender, non-distended MUSCULOSKELETAL:  1+ non-pitting edema bilaterally; No deformity  SKIN: Warm and dry NEUROLOGIC:  Alert and oriented x 3 PSYCHIATRIC:  Normal affect   ASSESSMENT:    1. Dyslipidemia   2. Palpitations   3. Controlled type 2 diabetes mellitus without complication, without long-term current use of insulin (HCC)   4. Excessive daytime sleepiness    PLAN:    In order of problems listed above:  Palpitations: Will place zio patch and follow up with results after 2 week completion. Will also obtain echo given dyspnea on exertion. Dyslipidemia: initiated statin therapy, atorvastatin 10 mg daily Type 2 DM: Most recent A1c 7.5 in 09/2021,  will likely need to start on a diabetic regimen, will be following up with PCP soon for further management and monitoring of this. Excessive daytime sleepiness: Likely also secondary to elevated BMI, encouraged weight loss and recommended sleep study which patient agreed to.    Medication Adjustments/Labs and Tests Ordered: Current medicines are reviewed at length with the patient today.  Concerns regarding medicines are outlined above.  No orders of the defined types were placed in this encounter.  No orders of the defined types were placed in this encounter.   Signed, [name], MD, [facility] 10/15/2021 9:03 AM    [facility]

## 2021-10-22 ENCOUNTER — Ambulatory Visit: Payer: BC Managed Care – PPO | Admitting: Vascular Surgery

## 2021-10-22 ENCOUNTER — Encounter: Payer: Self-pay | Admitting: Vascular Surgery

## 2021-10-22 ENCOUNTER — Other Ambulatory Visit: Payer: Self-pay

## 2021-10-22 VITALS — BP 129/76 | HR 71 | Temp 98.3°F | Resp 20 | Ht 72.0 in | Wt 330.0 lb

## 2021-10-22 DIAGNOSIS — I872 Venous insufficiency (chronic) (peripheral): Secondary | ICD-10-CM

## 2021-10-22 NOTE — Progress Notes (Signed)
ASSESSMENT & PLAN   COMBINED CHRONIC VENOUS INSUFFICIENCY AND LYMPHEDEMA: I think this patient has combined chronic venous insufficiency and lymphedema.  He has CEAP C4b venous disease (hyperpigmentation with lipodermatosclerosis).  In addition, he has significant lymphedema associated with hyperkeratosis.  We have had a long discussion about the importance of intermittent leg elevation and the proper positioning for this.  I have encouraged him to begin wearing his compression stockings again.  Since he bends down at work I would recommend a knee-high stocking.  He should wear at least a 15-20 gradient stocking but if possible I would try a 20-30 gradient stocking if he tolerates this.  I have encouraged him to avoid prolonged sitting and standing.  We have discussed importance of exercise specifically walking and water aerobics.  In addition we have discussed the importance of maintaining a healthy weight as central obesity especially increases lower extremity venous pressure.  I have encouraged him to keep his skin well lubricated in the winter especially.  If his swelling does not improve I think he would be a candidate for a BioTAB lymphedema pump.  Currently however he works and I be hard to get in 2 hours a day with this.  REASON FOR CONSULT:    Bilateral lower extremity edema.  Left greater than right.  The consult is requested by Sandford Craze, NP.  HPI:   Angel Merritt is a 55 y.o. male who presents with bilateral lower extremity swelling.  He tells me that he thinks he began having leg swelling after he got his COVID-vaccine at the end of 2020.  The swelling has gradually progressed.  The swelling is more significant on the left side.  He denies any previous history of DVT.  He has previously had laser ablation of the right great saphenous vein in the remote past and then more recently in 2016 had a laser ablation of the left great saphenous vein.  He describes some aching pain and  heaviness associated with the swelling.  The symptoms are relieved with elevation.  He does not think that compression stockings helped significantly.  His symptoms are worse at the end of the day.  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   Past Medical History:  Diagnosis Date   B12 deficiency 09/07/2020   Diabetes mellitus type 2, controlled (HCC) 09/09/2021   ED (erectile dysfunction)    GERD (gastroesophageal reflux disease)    Hyperlipidemia    OA (osteoarthritis)    Umbilical hernia    Varicose veins    Wears glasses     Family History  Problem Relation Age of Onset   Colon cancer Father 7       died at 34 (following a hernia repair)   Cancer Father    Coronary artery disease Mother 85       (has 3 stents)   Heart disease Mother    Cancer Paternal Grandfather        brain tumor   Coronary artery disease Maternal Grandmother 18       smoker   Esophageal cancer Neg Hx    Stomach cancer Neg Hx    Rectal cancer Neg Hx     SOCIAL HISTORY: Social History   Tobacco Use   Smoking status: Never   Smokeless tobacco: Never  Substance Use Topics   Alcohol use: Not Currently    Allergies  Allergen Reactions   Effexor [Venlafaxine] Other (See Comments)  Severe daytime somnolence, head ache and weight gain   Lexapro [Escitalopram Oxalate]     Weight gain    Current Outpatient Medications  Medication Sig Dispense Refill   famotidine (PEPCID) 10 MG tablet Take 10 mg by mouth as needed for heartburn or indigestion.     ibuprofen (ADVIL,MOTRIN) 200 MG tablet Take 200 mg by mouth as needed for headache, mild pain or moderate pain.     rosuvastatin (CRESTOR) 10 MG tablet Take 1 tablet (10 mg total) by mouth daily. 90 tablet 1   No current facility-administered medications for this visit.    REVIEW OF SYSTEMS:  [X]  denotes positive finding, [ ]  denotes negative finding Cardiac  Comments:  Chest pain or chest pressure:    Shortness of breath upon  exertion:    Short of breath when lying flat:    Irregular heart rhythm:        Vascular    Pain in calf, thigh, or hip brought on by ambulation:    Pain in feet at night that wakes you up from your sleep:     Blood clot in your veins:    Leg swelling:  x       Pulmonary    Oxygen at home:    Productive cough:     Wheezing:         Neurologic    Sudden weakness in arms or legs:     Sudden numbness in arms or legs:     Sudden onset of difficulty speaking or slurred speech:    Temporary loss of vision in one eye:     Problems with dizziness:         Gastrointestinal    Blood in stool:     Vomited blood:         Genitourinary    Burning when urinating:     Blood in urine:        Psychiatric    Major depression:         Hematologic    Bleeding problems:    Problems with blood clotting too easily:        Skin    Rashes or ulcers:        Constitutional    Fever or chills:    -  PHYSICAL EXAM:   Vitals:   10/22/21 1403  BP: 129/76  Pulse: 71  Resp: 20  Temp: 98.3 F (36.8 C)  SpO2: 94%  Weight: (!) 330 lb (149.7 kg)  Height: 6' (1.829 m)   Body mass index is 44.76 kg/m.  GENERAL: The patient is a well-nourished male, in no acute distress. The vital signs are documented above. CARDIAC: There is a regular rate and rhythm.  VASCULAR: I do not detect carotid bruits. He has palpable pedal pulses. He has significant bilateral lower extremity swelling with nonpitting edema consistent with lymphedema.  He also has some hyperpigmentation consistent with chronic venous insufficiency.     PULMONARY: There is good air exchange bilaterally without wheezing or rales. ABDOMEN: Soft and non-tender with normal pitched bowel sounds.  MUSCULOSKELETAL: There are no major deformities. NEUROLOGIC: No focal weakness or paresthesias are detected. SKIN: There are no ulcers or rashes noted. PSYCHIATRIC: The patient has a normal affect.  DATA:    VENOUS DUPLEX: I have  independently interpreted the venous duplex scan that was done on 10/08/2021.  This was of the left lower extremity only.  There was no evidence of DVT.  There was significant deep  venous reflux involving the common femoral vein, femoral vein, and popliteal vein.  Reflux was up to 4 seconds.  He has had previous laser ablation of the left great saphenous vein     Waverly [facility]

## 2021-10-23 ENCOUNTER — Ambulatory Visit (INDEPENDENT_AMBULATORY_CARE_PROVIDER_SITE_OTHER): Payer: BC Managed Care – PPO | Admitting: Family

## 2021-10-23 ENCOUNTER — Encounter: Payer: Self-pay | Admitting: Family

## 2021-10-23 ENCOUNTER — Telehealth: Payer: Self-pay | Admitting: Family

## 2021-10-23 VITALS — BP 124/83 | HR 81 | Temp 98.4°F | Resp 16 | Ht 72.0 in | Wt 330.0 lb

## 2021-10-23 DIAGNOSIS — Z0000 Encounter for general adult medical examination without abnormal findings: Secondary | ICD-10-CM

## 2021-10-23 DIAGNOSIS — E669 Obesity, unspecified: Secondary | ICD-10-CM | POA: Diagnosis not present

## 2021-10-23 DIAGNOSIS — E1169 Type 2 diabetes mellitus with other specified complication: Secondary | ICD-10-CM

## 2021-10-23 LAB — MICROALBUMIN / CREATININE URINE RATIO
Creatinine,U: 109 mg/dL
Microalb Creat Ratio: 0.8 mg/g (ref 0.0–30.0)
Microalb, Ur: 0.8 mg/dL (ref 0.0–1.9)

## 2021-10-23 NOTE — Telephone Encounter (Signed)
Records release faxed 

## 2021-10-23 NOTE — Assessment & Plan Note (Signed)
Discussed diabetic diet, exercise and the importance of weight loss. He has gained nearly 100 pounds since I began seeing him in 2016.  [facility] to [facility].  Recommended pneumovax but he declined.  Recommended covid booster- he declines as he believes it caused his LE edema. I advised pt that is unlikely but that it is likely due to his obesity and venous insufficiency. Colo up to date. Had normal PSA last year.

## 2021-10-23 NOTE — Patient Instructions (Signed)
Please complete lab work prior to leaving.   

## 2021-10-23 NOTE — Telephone Encounter (Signed)
Please call my eye dr in [place] and request most recent eye exam.

## 2021-10-23 NOTE — Progress Notes (Signed)
Subjective:     Patient ID: Angel Merritt, male    DOB: 07/31/1966, 55 y.o.   MRN: [id number]  Chief Complaint  Patient presents with   Annual Exam    HPI Patient is in today for cpx.  Patient presents today for complete physical.  Immunizations: declines covid booster.  Declines pneumovax.  Diet: "terrible" Exercise: just "walking at work" Colonoscopy: 11/07/20 Vision:  1/21, [facility] Dr. Pernell Dupre [facility] Readings from Last 3 Encounters:  10/23/21 (!) 330 lb (149.7 kg)  10/22/21 (!) 330 lb (149.7 kg)  10/15/21 (!) 331 lb (150.1 kg)   Lab Results  Component Value Date   PSA 0.65 09/05/2020   PSA 0.58 09/01/2018       Health Maintenance Due  Topic Date Due   OPHTHALMOLOGY EXAM  Never done   URINE MICROALBUMIN  Never done    Past Medical History:  Diagnosis Date   B12 deficiency 09/07/2020   Diabetes mellitus type 2, controlled (HCC) 09/09/2021   ED (erectile dysfunction)    GERD (gastroesophageal reflux disease)    Hyperlipidemia    OA (osteoarthritis)    Umbilical hernia    Varicose veins    Wears glasses     Past Surgical History:  Procedure Laterality Date   COLONOSCOPY WITH PROPOFOL  11-07-2020  dr nandigam   ENDOVENOUS ABLATION SAPHENOUS VEIN W/ LASER Left 11-06-2015   endovenous laser ablation left greater saphenous vein and stab phlebectomies > 20 incisions  left leg  by Gretta Began MD   UMBILICAL HERNIA REPAIR N/A 11/21/2020   Procedure: LAPAROSCOPIC UMBILICAL HERNIA WITH MESH;  Surgeon: Axel Filler, MD;  Location: [facility];  Service: General;  Laterality: N/A;    Family History  Problem Relation Age of Onset   Colon cancer Father 65       died at 32 (following a hernia repair)   Cancer Father    Coronary artery disease Mother 56       (has 3 stents)   Heart disease Mother    Cancer Paternal Grandfather        brain tumor   Coronary artery disease Maternal Grandmother 30       smoker   Esophageal cancer Neg Hx     Stomach cancer Neg Hx    Rectal cancer Neg Hx     Social History   Socioeconomic History   Marital status: Married    Spouse name: Not on file   Number of children: Not on file   Years of education: Not on file   Highest education level: Not on file  Occupational History   Not on file  Tobacco Use   Smoking status: Never   Smokeless tobacco: Never  Vaping Use   Vaping Use: Never used  Substance and Sexual Activity   Alcohol use: Not Currently   Drug use: Never   Sexual activity: Not on file    Comment: vasectomy  2017  Other Topics Concern   Not on file  Social History Narrative   4 children- 33 (daughter), 63 (daughter), 8 son, 5 son   Works as Curator   Married   Enjoys baseball, golf, gym   Social Determinants of Corporate investment banker Strain: Not on Ship broker Insecurity: Not on file  Transportation Needs: Not on file  Physical Activity: Not on file  Stress: Not on file  Social Connections: Not on file  Intimate Partner Violence: Not on file  Outpatient Medications Prior to Visit  Medication Sig Dispense Refill   famotidine (PEPCID) 10 MG tablet Take 10 mg by mouth as needed for heartburn or indigestion.     ibuprofen (ADVIL,MOTRIN) 200 MG tablet Take 200 mg by mouth as needed for headache, mild pain or moderate pain.     rosuvastatin (CRESTOR) 10 MG tablet Take 1 tablet (10 mg total) by mouth daily. 90 tablet 1   No facility-administered medications prior to visit.    Allergies  Allergen Reactions   Effexor [Venlafaxine] Other (See Comments)    Severe daytime somnolence, head ache and weight gain   Lexapro [Escitalopram Oxalate]     Weight gain    Review of Systems  Constitutional:  Negative for weight loss.  HENT:  Negative for congestion.   Eyes:  Negative for blurred vision.  Respiratory:  Negative for cough and shortness of breath.   Cardiovascular:  Positive for leg swelling. Negative for chest pain.  Gastrointestinal:  Negative for  constipation and diarrhea.  Genitourinary:  Negative for dysuria and frequency.  Musculoskeletal:  Negative for joint pain and myalgias.  Skin:  Negative for rash.  Neurological:  Negative for headaches.  Psychiatric/Behavioral:  Negative for depression.       Objective:    Physical Exam  BP 124/83 (BP Location: Left Arm, Patient Position: Sitting, Cuff Size: Large)   Pulse 81   Temp 98.4 F (36.9 C) (Oral)   Resp 16   Ht 6' (1.829 m)   Wt (!) 330 lb (149.7 kg)   SpO2 99%   BMI 44.76 kg/m  Wt Readings from Last 3 Encounters:  10/23/21 (!) 330 lb (149.7 kg)  10/22/21 (!) 330 lb (149.7 kg)  10/15/21 (!) 331 lb (150.1 kg)   Physical Exam  Constitutional: He is oriented to person, place, and time. He appears well-developed and well-nourished. No distress.  HENT:  Head: Normocephalic and atraumatic.  Right Ear: Tympanic membrane and ear canal normal.  Left Ear: Tympanic membrane and ear canal normal.  Mouth/Throat: Not examined, pt wearing mask Eyes: Pupils are equal, round, and reactive to light. No scleral icterus.  Neck: Normal range of motion. No thyromegaly present.  Cardiovascular: Normal rate and regular rhythm.   No murmur heard. Pulmonary/Chest: Effort normal and breath sounds normal. No respiratory distress. He has no wheezes. He has no rales. He exhibits no tenderness.  Abdominal: Soft. Bowel sounds are normal. He exhibits no distension and no mass. There is no tenderness. There is no rebound and no guarding.  Musculoskeletal: He exhibits no edema.  Lymphadenopathy:    He has no cervical adenopathy.  Neurological: He is alert and oriented to person, place, and time. He has normal patellar reflexes. He exhibits normal muscle tone. Coordination normal.  Skin: Skin is warm and dry.  Psychiatric: He has a normal mood and affect. His behavior is normal. Judgment and thought content normal.  Diabetic Foot Exam - Simple   Simple Foot Form Diabetic Foot exam was  performed with the following findings: Yes 10/23/2021  7:36 AM  Visual Inspection No deformities, no ulcerations, no other skin breakdown bilaterally: Yes Sensation Testing Intact to touch and monofilament testing bilaterally: Yes Pulse Check Posterior Tibialis and Dorsalis pulse intact bilaterally: Yes Comments           Assessment & Plan:       Assessment & Plan:   Problem List Items Addressed This Visit       Unprioritized   Routine general  medical examination at a health care facility - Primary    Discussed diabetic diet, exercise and the importance of weight loss. He has gained nearly 100 pounds since I began seeing him in 2016.  [facility].  Recommended pneumovax but he declined.  Recommended covid booster- he declines as he believes it caused his LE edema. I advised pt that is unlikely but that it is likely due to his obesity and venous insufficiency. Colo up to date. Had normal PSA last year.       Diabetes mellitus type 2 in obese [facility])    Lab Results  Component Value Date   HGBA1C 7.5 (H) 09/09/2021   Lab Results  Component Value Date   LDLCALC 134 (H) 09/05/2020   CREATININE 1.03 09/07/2021  Cardiology has placed him on a statin. Recommended that he keep up with his annual eye exams.       Relevant Orders   Urine Microalbumin w/creat. ratio   Other Visit Diagnoses     Morbid obesity (HCC)       Relevant Orders   Amb Ref to Medical Weight Management       I am having Liston Alba "[name]" maintain his ibuprofen, famotidine, and rosuvastatin.  No orders of the defined types were placed in this encounter.

## 2021-10-23 NOTE — Assessment & Plan Note (Signed)
Lab Results  Component Value Date   HGBA1C 7.5 (H) 09/09/2021   Lab Results  Component Value Date   LDLCALC 134 (H) 09/05/2020   CREATININE 1.03 09/07/2021   Cardiology has placed him on a statin. Recommended that he keep up with his annual eye exams.

## 2021-10-26 ENCOUNTER — Telehealth: Payer: Self-pay | Admitting: *Deleted

## 2021-10-26 NOTE — Telephone Encounter (Signed)
Patient notified of sleep study appointment details. 

## 2021-11-04 DIAGNOSIS — E119 Type 2 diabetes mellitus without complications: Secondary | ICD-10-CM | POA: Diagnosis not present

## 2021-11-04 DIAGNOSIS — R002 Palpitations: Secondary | ICD-10-CM | POA: Diagnosis not present

## 2021-11-04 DIAGNOSIS — E785 Hyperlipidemia, unspecified: Secondary | ICD-10-CM | POA: Diagnosis not present

## 2021-11-19 ENCOUNTER — Telehealth: Payer: Self-pay | Admitting: Emergency Medicine

## 2021-11-19 DIAGNOSIS — I472 Ventricular tachycardia, unspecified: Secondary | ICD-10-CM

## 2021-11-19 MED ORDER — METOPROLOL TARTRATE 25 MG PO TABS: 25.0000 mg | ORAL_TABLET | Freq: Two times a day (BID) | ORAL | 1 refills | Status: DC

## 2021-11-19 NOTE — Telephone Encounter (Signed)
-----   Message from [name], MD sent at 11/16/2021 11:30 AM EST ----- Quite a worrisome finding on the monitor he does have multiple episode of ventricular tachycardia total of 1067!Angel Merritt  Please schedule him to have appointment with EP, schedule him to have an echocardiogram to assess left ventricle ejection fraction, start metoprolol tartrate 25 mg twice daily, also check his Chem-7 as well as magnesium level

## 2021-11-19 NOTE — Telephone Encounter (Signed)
Patient informed of results. Patient will start metoprolol succinate 25 mg twice daily. Ep referral placed and echo order placed. No further questions.

## 2021-11-24 DIAGNOSIS — R002 Palpitations: Secondary | ICD-10-CM | POA: Diagnosis not present

## 2021-11-24 DIAGNOSIS — E119 Type 2 diabetes mellitus without complications: Secondary | ICD-10-CM | POA: Diagnosis not present

## 2021-11-24 DIAGNOSIS — E785 Hyperlipidemia, unspecified: Secondary | ICD-10-CM | POA: Diagnosis not present

## 2021-11-24 DIAGNOSIS — G4719 Other hypersomnia: Secondary | ICD-10-CM | POA: Diagnosis not present

## 2021-11-25 LAB — LIPID PANEL
Chol/HDL Ratio: 4.4 ratio (ref 0.0–5.0)
Cholesterol, Total: 155 mg/dL (ref 100–199)
HDL: 35 mg/dL — ABNORMAL LOW (ref >39)
LDL Chol Calc (NIH): 95 mg/dL (ref 0–99)
Triglycerides: 142 mg/dL (ref 0–149)
VLDL Cholesterol Cal: 25 mg/dL (ref 5–40)

## 2021-11-25 LAB — HEPATIC FUNCTION PANEL
ALT: 25 IU/L (ref 0–44)
AST: 13 IU/L (ref 0–40)
Albumin: 4.3 g/dL (ref 3.8–4.9)
Alkaline Phosphatase: 119 IU/L (ref 44–121)
Bilirubin Total: 0.4 mg/dL (ref 0.0–1.2)
Bilirubin, Direct: 0.13 mg/dL (ref 0.00–0.40)
Total Protein: 6.5 g/dL (ref 6.0–8.5)

## 2021-11-27 ENCOUNTER — Telehealth: Payer: Self-pay

## 2021-11-27 DIAGNOSIS — E785 Hyperlipidemia, unspecified: Secondary | ICD-10-CM

## 2021-11-27 MED ORDER — ROSUVASTATIN CALCIUM 20 MG PO TABS: 20.0000 mg | ORAL_TABLET | Freq: Every day | ORAL | 3 refills | Status: DC

## 2021-11-27 NOTE — Telephone Encounter (Signed)
Left message on patients voicemail to please return our call.   

## 2021-11-27 NOTE — Telephone Encounter (Signed)
Spoke with patient regarding results and recommendation.  Patient verbalizes understanding and is agreeable to plan of care. Advised patient to call back with any issues or concerns.  

## 2021-11-27 NOTE — Telephone Encounter (Signed)
-----   Message from [name], MD sent at 11/26/2021  9:31 AM EST ----- Cholesterol better but still not perfect.  Please double the dose of statin, fasting lipid profile, AST ALT in 6 weeks

## 2021-11-27 NOTE — Addendum Note (Signed)
Addended by: Delorse Limber I on: 11/27/2021 09:59 AM   Modules accepted: Orders

## 2021-12-08 ENCOUNTER — Ambulatory Visit (HOSPITAL_BASED_OUTPATIENT_CLINIC_OR_DEPARTMENT_OTHER): Payer: BC Managed Care – PPO | Attending: Cardiology | Admitting: Cardiovascular Disease

## 2021-12-08 ENCOUNTER — Other Ambulatory Visit: Payer: Self-pay

## 2021-12-08 ENCOUNTER — Encounter: Payer: Self-pay | Admitting: Cardiology

## 2021-12-08 ENCOUNTER — Ambulatory Visit: Payer: BC Managed Care – PPO | Admitting: Cardiology

## 2021-12-08 VITALS — BP 122/84 | HR 76 | Ht 72.0 in | Wt 328.0 lb

## 2021-12-08 DIAGNOSIS — E119 Type 2 diabetes mellitus without complications: Secondary | ICD-10-CM | POA: Diagnosis not present

## 2021-12-08 DIAGNOSIS — G4719 Other hypersomnia: Secondary | ICD-10-CM | POA: Insufficient documentation

## 2021-12-08 DIAGNOSIS — E785 Hyperlipidemia, unspecified: Secondary | ICD-10-CM | POA: Diagnosis not present

## 2021-12-08 DIAGNOSIS — G4733 Obstructive sleep apnea (adult) (pediatric): Secondary | ICD-10-CM | POA: Diagnosis not present

## 2021-12-08 DIAGNOSIS — I472 Ventricular tachycardia, unspecified: Secondary | ICD-10-CM | POA: Diagnosis not present

## 2021-12-08 DIAGNOSIS — R002 Palpitations: Secondary | ICD-10-CM | POA: Diagnosis not present

## 2021-12-08 NOTE — Patient Instructions (Addendum)
Medication Instructions:  Your physician recommends that you continue on your current medications as directed. Please refer to the Current Medication list given to you today. *If you need a refill on your cardiac medications before your next appointment, please call your pharmacy*  Lab Work: None. If you have labs (blood work) drawn today and your tests are completely normal, you will receive your results only by: MyChart Message (if you have MyChart) OR A paper copy in the mail If you have any lab test that is abnormal or we need to change your treatment, we will call you to review the results.  Testing/Procedures: None.  Follow-Up: At [facility], you and your health needs are our priority.  As part of our continuing mission to provide you with exceptional heart care, we have created designated Provider Care Teams.  These Care Teams include your primary Cardiologist (physician) and Advanced Practice Providers (APPs -  Physician Assistants and Nurse Practitioners) who all work together to provide you with the care you need, when you need it.  Your physician wants you to follow-up in: 03/16/22 at 8 am with  Loman Brooklyn, MD  We recommend signing up for the patient portal called "MyChart".  Sign up information is provided on this After Visit Summary.  MyChart is used to connect with patients for Virtual Visits (Telemedicine).  Patients are able to view lab/test results, encounter notes, upcoming appointments, etc.  Non-urgent messages can be sent to your provider as well.   To learn more about what you can do with MyChart, go to [email or web address].    Any Other Special Instructions Will Be Listed Below (If Applicable).

## 2021-12-08 NOTE — Progress Notes (Signed)
Electrophysiology Office Note   Date:  12/08/2021   ID:  Angel Merritt, DOB [birth year], MRN [id number]  PCP:  Sandford Craze, NP  Cardiologist:  Bing Matter Primary Electrophysiologist:  Lanyiah Brix Jorja Loa, MD    Chief Complaint: PVC   History of Present Illness: Angel Merritt is a 56 y.o. male who is being seen today for the evaluation of PVC at the request of [name], MD. Presenting today for electrophysiology evaluation.  He has a history significant for type 2 diabetes, hyperlipidemia, PVCs.  He started having tachycardia and palpitations October 2022.  This was the first time he had felt this way.  He also has dyspnea with exertion.  He also gets leg swelling which he experienced for the last year since he had his COVID-vaccine.    Today, he denies symptoms of palpitations, chest pain, orthopnea, PND, lower extremity edema, claudication, dizziness, presyncope, syncope, bleeding, or neurologic sequela. The patient is tolerating medications without difficulties.  He is unaware of PVCs.  He denies palpitations.  He does have dyspnea on exertion.  He also falls asleep quite quickly.  He is planning to get a sleep study tonight.  He was noted to have a high burden of PVCs at 16% with nonsustained VT episodes.  He was prescribed metoprolol, but not start the medication as he was concerned about side effects.   Past Medical History:  Diagnosis Date   B12 deficiency 09/07/2020   Diabetes mellitus type 2, controlled (HCC) 09/09/2021   ED (erectile dysfunction)    GERD (gastroesophageal reflux disease)    Hyperlipidemia    OA (osteoarthritis)    Umbilical hernia    Varicose veins    Wears glasses    Past Surgical History:  Procedure Laterality Date   COLONOSCOPY WITH PROPOFOL  11-07-2020  dr nandigam   ENDOVENOUS ABLATION SAPHENOUS VEIN W/ LASER Left 11-06-2015   endovenous laser ablation left greater saphenous vein and stab phlebectomies > 20 incisions  left leg  by  Gretta Began MD   UMBILICAL HERNIA REPAIR N/A 11/21/2020   Procedure: LAPAROSCOPIC UMBILICAL HERNIA WITH MESH;  Surgeon: Axel Filler, MD;  Location: [facility] [place];  Service: General;  Laterality: N/A;     Current Outpatient Medications  Medication Sig Dispense Refill   famotidine (PEPCID) 10 MG tablet Take 10 mg by mouth as needed for heartburn or indigestion.     ibuprofen (ADVIL,MOTRIN) 200 MG tablet Take 200 mg by mouth as needed for headache, mild pain or moderate pain.     metoprolol tartrate (LOPRESSOR) 25 MG tablet Take 1 tablet (25 mg total) by mouth 2 (two) times daily. 180 tablet 1   rosuvastatin (CRESTOR) 20 MG tablet Take 1 tablet (20 mg total) by mouth daily. 90 tablet 3   No current facility-administered medications for this visit.    Allergies:   Effexor [venlafaxine] and Lexapro [escitalopram oxalate]   Social History:  The patient  reports that he has never smoked. He has never used smokeless tobacco. He reports that he does not currently use alcohol. He reports that he does not use drugs.   Family History:  The patient's family history includes Cancer in his father and paternal grandfather; Colon cancer (age of onset: 31) in his father; Coronary artery disease (age of onset: 16) in his maternal grandmother and mother; Heart disease in his mother.    ROS:  Please see the history of present illness.   Otherwise, review of systems is positive for none.  All other systems are reviewed and negative.    PHYSICAL EXAM: VS:  BP 122/84    Pulse 76    Ht 6' (1.829 m)    Wt (!) 328 lb (148.8 kg)    BMI 44.48 kg/m  , BMI Body mass index is 44.48 kg/m. GEN: Well nourished, well developed, in no acute distress  HEENT: normal  Neck: no JVD, carotid bruits, or masses Cardiac: RRR; no murmurs, rubs, or gallops,no edema  Respiratory:  clear to auscultation bilaterally, normal work of breathing GI: soft, nontender, nondistended, + BS MS: no deformity or atrophy   Skin: warm and dry Neuro:  Strength and sensation are intact Psych: euthymic mood, full affect  EKG:  EKG is not ordered today. Personal review of the ekg ordered 10/15/21 shows sinus rhythm, PVC  Recent Labs: 09/07/2021: BUN 17; Creatinine, Ser 1.03; Hemoglobin 13.3; Platelets 278.0; Potassium 4.4; Sodium 138; TSH 1.45 11/24/2021: ALT 25    Lipid Panel     Component Value Date/Time   CHOL 155 11/24/2021 0817   TRIG 142 11/24/2021 0817   HDL 35 (L) 11/24/2021 0817   CHOLHDL 4.4 11/24/2021 0817   CHOLHDL 4.9 09/05/2020 0801   VLDL 37.0 09/04/2019 0720   LDLCALC 95 11/24/2021 0817   LDLCALC 134 (H) 09/05/2020 0801     Wt Readings from Last 3 Encounters:  12/08/21 (!) 328 lb (148.8 kg)  10/23/21 (!) 330 lb (149.7 kg)  10/22/21 (!) 330 lb (149.7 kg)      Other studies Reviewed: Additional studies/ records that were reviewed today include: Cardiac monitor 11/16/2021 personally reviewed Review of the above records today demonstrates:  Multiple episodes of ventricular tachycardia total of 1067 with total burden of ventricular ectopy of 16.1.  Some episode of ventricular tachycardia did get very short coupling.  Symptoms after PVCs   ASSESSMENT AND PLAN:  1.  1.  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  Also having episodes of ventricular tachycardia.  He has not started the metoprolol that was initially prescribed as he was worried about side effects.  I have assured him that at this dose, he should not have side effects.  If he does feel drowsy, he can call back and change to a once daily formulation.  I am going to wait until his echo is done prior to starting antiarrhythmic medications.  I do think he would benefit from suppression of his PVCs.    2.  Snoring: Sleep study planned for tonight.  Case discussed with primary cardiology  Current medicines are reviewed at length with the patient today.   The patient does not have concerns regarding his medicines.  The following changes were  made today:  none  Labs/ tests ordered today include:  No orders of the defined types were placed in this encounter.    Disposition:   FU with Dimitris Shanahan 3 months  Signed, Karon Cotterill Jorja Loa, MD  12/08/2021 3:30 PM     [facility] [address] [place] [address] [phone] (office) [phone] (fax)

## 2021-12-21 ENCOUNTER — Other Ambulatory Visit: Payer: Self-pay

## 2021-12-21 ENCOUNTER — Encounter: Payer: Self-pay | Admitting: Cardiology

## 2021-12-21 ENCOUNTER — Ambulatory Visit: Payer: BC Managed Care – PPO | Admitting: Cardiology

## 2021-12-21 VITALS — BP 130/76 | HR 62 | Ht 72.0 in | Wt 326.0 lb

## 2021-12-21 DIAGNOSIS — R002 Palpitations: Secondary | ICD-10-CM | POA: Diagnosis not present

## 2021-12-21 DIAGNOSIS — I10 Essential (primary) hypertension: Secondary | ICD-10-CM

## 2021-12-21 DIAGNOSIS — I472 Ventricular tachycardia, unspecified: Secondary | ICD-10-CM

## 2021-12-21 DIAGNOSIS — E1169 Type 2 diabetes mellitus with other specified complication: Secondary | ICD-10-CM

## 2021-12-21 DIAGNOSIS — E785 Hyperlipidemia, unspecified: Secondary | ICD-10-CM

## 2021-12-21 DIAGNOSIS — E669 Obesity, unspecified: Secondary | ICD-10-CM

## 2021-12-21 DIAGNOSIS — I493 Ventricular premature depolarization: Secondary | ICD-10-CM

## 2021-12-21 DIAGNOSIS — G4719 Other hypersomnia: Secondary | ICD-10-CM

## 2021-12-21 NOTE — Progress Notes (Signed)
Cardiology Office Note:    Date:  12/21/2021   ID:  Angel Merritt, DOB 02/06/1966, MRN [id number]  PCP:  Debbrah Alar, NP  Cardiologist:  Jenne Campus, MD    Referring MD: Debbrah Alar, NP   Chief Complaint  Patient presents with   Follow-up  Am doing fine  History of Present Illness:    Angel Merritt is a 56 y.o. male with past medical history significant for essential hypertension, diabetes, B12 deficiency, hyperlipidemia, chronic swelling of lower extremities.  He was referred to [place] because of palpitations as well as because of swelling of lower extremities.  Work-up included monitoring which showed multiple worrisome arrhythmias including ventricular tachycardia total of 1067.  Also high burden of ventricular ectopy.  We initiated therapy with beta-blocker but initially he did not want to take it but now it looks like he does take it.  Part of evaluation should include evaluation of left ventricle ejection fraction however he refused to have echocardiogram because of price issue.  He was referred to EP.  Overall he is doing well still swelling of lower extremities during indicis majority of it lymphedema.  I we will try to give him small dose of diuretic to see if I can give him symptomatology relief.  Denies have any chest pain tightness squeezing pressure burning chest.  Past Medical History:  Diagnosis Date   B12 deficiency 09/07/2020   Diabetes mellitus type 2, controlled ([place]) 09/09/2021   ED (erectile dysfunction)    GERD (gastroesophageal reflux disease)    Hyperlipidemia    OA (osteoarthritis)    Umbilical hernia    Varicose veins    Wears glasses     Past Surgical History:  Procedure Laterality Date   COLONOSCOPY WITH PROPOFOL  11-07-2020  dr nandigam   ENDOVENOUS ABLATION SAPHENOUS VEIN W/ LASER Left 11-06-2015   endovenous laser ablation left greater saphenous vein and stab phlebectomies > 20 incisions  left leg  by Curt Jews MD   UMBILICAL  HERNIA REPAIR N/A 11/21/2020   Procedure: LAPAROSCOPIC UMBILICAL HERNIA WITH MESH;  Surgeon: Ralene Ok, MD;  Location: [facility];  Service: General;  Laterality: N/A;    Current Medications: Current Meds  Medication Sig   famotidine (PEPCID) 10 MG tablet Take 10 mg by mouth as needed for heartburn or indigestion.   ibuprofen (ADVIL,MOTRIN) 200 MG tablet Take 200 mg by mouth as needed for headache, mild pain or moderate pain.   metoprolol tartrate (LOPRESSOR) 25 MG tablet Take 1 tablet (25 mg total) by mouth 2 (two) times daily.   rosuvastatin (CRESTOR) 20 MG tablet Take 1 tablet (20 mg total) by mouth daily.     Allergies:   Effexor [venlafaxine] and Lexapro [escitalopram oxalate]   Social History   Socioeconomic History   Marital status: Married    Spouse name: Not on file   Number of children: Not on file   Years of education: Not on file   Highest education level: Not on file  Occupational History   Not on file  Tobacco Use   Smoking status: Never   Smokeless tobacco: Never  Vaping Use   Vaping Use: Never used  Substance and Sexual Activity   Alcohol use: Not Currently   Drug use: Never   Sexual activity: Not on file    Comment: vasectomy  2017  Other Topics Concern   Not on file  Social History Narrative   4 children- 74 (daughter), 109 (daughter), 8 son, 5 son  Works as Dealer   Married   Enjoys baseball, golf, gym   Social Determinants of Radio broadcast assistant Strain: Not on [employer] Insecurity: Not on file  Transportation Needs: Not on file  Physical Activity: Not on file  Stress: Not on file  Social Connections: Not on file     Family History: The patient's family history includes Cancer in his father and paternal grandfather; Colon cancer (age of onset: 51) in his father; Coronary artery disease (age of onset: 12) in his maternal grandmother and mother; Heart disease in his mother. There is no history of Esophageal  cancer, Stomach cancer, or Rectal cancer. ROS:   Please see the history of present illness.    All 14 point review of systems negative except as described per history of present illness  EKGs/Labs/Other Studies Reviewed:      Recent Labs: 09/07/2021: BUN 17; Creatinine, Ser 1.03; Hemoglobin 13.3; Platelets 278.0; Potassium 4.4; Sodium 138; TSH 1.45 11/24/2021: ALT 25  Recent Lipid Panel    Component Value Date/Time   CHOL 155 11/24/2021 0817   TRIG 142 11/24/2021 0817   HDL 35 (L) 11/24/2021 0817   CHOLHDL 4.4 11/24/2021 0817   CHOLHDL 4.9 09/05/2020 0801   VLDL 37.0 09/04/2019 0720   LDLCALC 95 11/24/2021 0817   LDLCALC 134 (H) 09/05/2020 0801    Physical Exam:    VS:  BP 130/76 (BP Location: Right Arm, Patient Position: Sitting)    Pulse 62    Ht 6' (1.829 m)    Wt (!) 326 lb (147.9 kg)    SpO2 98%    BMI 44.21 kg/m     Wt Readings from Last 3 Encounters:  12/21/21 (!) 326 lb (147.9 kg)  12/08/21 (!) 320 lb (145.2 kg)  12/08/21 (!) 328 lb (148.8 kg)     GEN:  Well nourished, well developed in no acute distress HEENT: Normal NECK: No JVD; No carotid bruits LYMPHATICS: No lymphadenopathy CARDIAC: RRR, no murmurs, no rubs, no gallops RESPIRATORY:  Clear to auscultation without rales, wheezing or rhonchi  ABDOMEN: Soft, non-tender, non-distended MUSCULOSKELETAL:  No edema; No deformity  SKIN: Warm and dry LOWER EXTREMITIES: no swelling NEUROLOGIC:  Alert and oriented x 3 PSYCHIATRIC:  Normal affect   ASSESSMENT:    1. Ventricular tachycardia   2. Ventricular ectopy   3. Essential hypertension   4. Palpitations   5. Excessive daytime sleepiness   6. Dyslipidemia   7. Diabetes mellitus type 2 in obese [facility])    PLAN:    In order of problems listed above:  Ventricular tachycardia.  He refused to do echocardiogram because of price of the test.  He is on beta-blocker seems to be tolerating well.  No dizziness no syncope, will continue present  management. Frequent ventricular ectopy again we cannot do echocardiogram because of price issue I told him how important this test is for multiple reasons he still prefer to wait and hopefully his deductible will be met and then he will be able to afford the test. Essential hypertension blood pressure seems to be well controlled today continue present management.  We can reduce this a little bit and I hope I will be able to get it with the diuretics.  I will check proBNP as well as Chem-7 today if Chem-7 is fine we will initiate small dose of diuretic. Excessive daytime somnolence, he tells me he did have a sleep study done however I see no documentation of it we will  try to look for test. Diabetes followed by internal medicine team hemoglobin A1c was 7.5 this is from 09/09/2021. However quite frustrating situation he need to be stratified for his arrhythmia that stratification should include assessment of left ventricle ejection fraction as well as ischemia work-up however he cannot afford it he does not want to do it, luckily he is able to tolerate beta-blocker quite well and does not have any symptoms meaning there is no dizziness no passing out.  I warned him about potential seriousness of the situation and the fact that he needs to have those test done he said he will let me know when he will be able to afford it.   Medication Adjustments/Labs and Tests Ordered: Current medicines are reviewed at length with the patient today.  Concerns regarding medicines are outlined above.  No orders of the defined types were placed in this encounter.  Medication changes: No orders of the defined types were placed in this encounter.   Signed, Park Liter, MD, [facility] 12/21/2021 8:57 AM    [place]

## 2021-12-21 NOTE — Patient Instructions (Signed)
Medication Instructions:  Your physician recommends that you continue on your current medications as directed. Please refer to the Current Medication list given to you today.  *If you need a refill on your cardiac medications before your next appointment, please call your pharmacy*   Lab Work: Your physician recommends that you return for lab work in: Labs today: Pro BNP, BMP If you have labs (blood work) drawn today and your tests are completely normal, you will receive your results only by: MyChart Message (if you have MyChart) OR A paper copy in the mail If you have any lab test that is abnormal or we need to change your treatment, we will call you to review the results.   Testing/Procedures: None   Follow-Up: At [facility], you and your health needs are our priority.  As part of our continuing mission to provide you with exceptional heart care, we have created designated Provider Care Teams.  These Care Teams include your primary Cardiologist (physician) and Advanced Practice Providers (APPs -  Physician Assistants and Nurse Practitioners) who all work together to provide you with the care you need, when you need it.  We recommend signing up for the patient portal called "MyChart".  Sign up information is provided on this After Visit Summary.  MyChart is used to connect with patients for Virtual Visits (Telemedicine).  Patients are able to view lab/test results, encounter notes, upcoming appointments, etc.  Non-urgent messages can be sent to your provider as well.   To learn more about what you can do with MyChart, go to [email or web address].    Your next appointment:   5 month(s)  The format for your next appointment:   In Person  Provider:   Gypsy Balsam, MD    Other Instructions None

## 2021-12-22 ENCOUNTER — Ambulatory Visit (HOSPITAL_BASED_OUTPATIENT_CLINIC_OR_DEPARTMENT_OTHER): Payer: BC Managed Care – PPO

## 2021-12-22 LAB — BASIC METABOLIC PANEL
BUN/Creatinine Ratio: 18 (ref 9–20)
BUN: 17 mg/dL (ref 6–24)
CO2: 23 mmol/L (ref 20–29)
Calcium: 9.8 mg/dL (ref 8.7–10.2)
Chloride: 102 mmol/L (ref 96–106)
Creatinine, Ser: 0.95 mg/dL (ref 0.76–1.27)
Glucose: 140 mg/dL — ABNORMAL HIGH (ref 70–99)
Potassium: 4.9 mmol/L (ref 3.5–5.2)
Sodium: 138 mmol/L (ref 134–144)
eGFR: 96 mL/min/{1.73_m2} (ref >59)

## 2021-12-22 LAB — PRO B NATRIURETIC PEPTIDE: NT-Pro BNP: 76 pg/mL (ref 0–121)

## 2021-12-24 DIAGNOSIS — E119 Type 2 diabetes mellitus without complications: Secondary | ICD-10-CM | POA: Diagnosis not present

## 2021-12-24 LAB — HM DIABETES EYE EXAM

## 2022-01-01 ENCOUNTER — Encounter (HOSPITAL_BASED_OUTPATIENT_CLINIC_OR_DEPARTMENT_OTHER): Payer: Self-pay | Admitting: Cardiovascular Disease

## 2022-01-01 NOTE — Procedures (Signed)
Patient Name: Angel Merritt, Angel Merritt Date: 12/08/2021 Gender: Male D.O.B: 25-Sep-1966 Age (years): 55 Referring Provider: Park Liter Height (inches): 72 Interpreting Physician: Shelva Majestic MD, ABSM Weight (lbs): 320 RPSGT: Laren Everts BMI: 60 MRN: [id number] Neck Size: 19.00  CLINICAL INFORMATION The patient is referred for a split night study with BPAP.  MEDICATIONS famotidine (PEPCID) 10 MG tablet ibuprofen (ADVIL,MOTRIN) 200 MG tablet metoprolol tartrate (LOPRESSOR) 25 MG tablet rosuvastatin (CRESTOR) 20 MG tablet  Medications self-administered by patient taken the night of the study : N/A  SLEEP STUDY TECHNIQUE As per the AASM Manual for the Scoring of Sleep and Associated Events v2.3 (April 2016) with a hypopnea requiring 4% desaturations.  The channels recorded and monitored were frontal, central and occipital EEG, electrooculogram (EOG), submentalis EMG (chin), nasal and oral airflow, thoracic and abdominal wall motion, anterior tibialis EMG, snore microphone, electrocardiogram, and pulse oximetry. Bi-level positive airway pressure (BiPAP) was initiated when the patient met split night criteria and was titrated according to treat sleep-disordered breathing.  RESPIRATORY PARAMETERS Diagnostic Total AHI (/hr): 16.9 RDI (/hr): 23.7 OA Index (/hr): - CA Index (/hr): 0.0 REM AHI (/hr): 38.2 NREM AHI (/hr): 14.9 Supine AHI (/hr): 12.4 Non-supine AHI (/hr): 24.53 Min O2 Sat (%): 74.0 Mean O2 (%): 91.1 Time below 88% (min): 8.2   Titration Optimal IPAP Pressure (cm): 15 Optimal EPAP Pressure (cm): 11 AHI at Optimal Pressure (/hr): 11.7 Min O2 at Optimal Pressure (%): 75.0 Sleep % at Optimal (%): 93 Supine % at Optimal (%): 100   SLEEP ARCHITECTURE The study was initiated at 10:38:36 PM and terminated at 5:15:57 AM. The total recorded time was 397.4 minutes. EEG confirmed total sleep time was 329.4 minutes yielding a sleep efficiency of 82.9%%. Sleep  onset after lights out was 24.0 minutes with a REM latency of 55.5 minutes. The patient spent 11.5%% of the night in stage N1 sleep, 62.4%% in stage N2 sleep, 0.0%% in stage N3 and 26.1% in REM. Wake after sleep onset (WASO) was 44.0 minutes. The Arousal Index was 16.6/hour.  LEG MOVEMENT DATA The total Periodic Limb Movements of Sleep (PLMS) were 0. The PLMS index was 0.0 .  CARDIAC DATA The 2 lead EKG demonstrated sinus rhythm. The mean heart rate was 100.0 beats per minute. Other EKG findings include: PVCs.  IMPRESSIONS - Moderate obstructive sleep apnea overall during the diagnostic portion of the study (AHI 16.9 /h); however, events were severe during REM sleep (AHI 38.2/h). CPAP was initiated at 5 cm and due to intolerance and central events was transitioned to BiPAP and titrated to 15/11 cm of water with improved tolerance. (AHI at 11/7: 4.9/h;  at 15/11: 11.7/h.) - No significant central sleep apnea occurred during the diagnostic portion of the study (CAI = 0.0/hour). - Severe oxygen desaturation during the diagnostic portion of the studyto a nadir of 74%. - The patient snored with soft snoring volume during the diagnostic portion of the study. - EKG findings include PVCs, PACs. - Clinically significant periodic limb movements of sleep did not occur during the study.  DIAGNOSIS - Obstructive Sleep Apnea (G47.33) - Nocturnal Hypoxemia (G47.36)  RECOMMENDATIONS - Recommend an initial trial of BiPAP Auto therapy with EPAP min of 7, PS of 4 and IPAP max of 20 cm H2O with heated humidification. A Medium size Resmed Full Face Mask AirFit F20 mask was used for the titration. - Effort should be made to optimize nasal and oropharyngeal patency. - Avoid alcohol, sedatives and other  CNS depressants that may worsen sleep apnea and disrupt normal sleep architecture. - Sleep hygiene should be reviewed to assess factors that may improve sleep quality. - Weight management and regular exercise should  be initiated or continued. - Recommend a download in 30 days and sleep clinic evaluation after one month of therapy.  [Electronically signed] 01/01/2022 09:49 AM  Shelva Majestic MD, [facility], [place], American Board of Sleep Medicine   NPI: [id number] [place] PH: [phone]   FX: [phone] [place]

## 2022-01-05 ENCOUNTER — Telehealth: Payer: Self-pay

## 2022-01-05 DIAGNOSIS — I10 Essential (primary) hypertension: Secondary | ICD-10-CM

## 2022-01-05 MED ORDER — HYDROCHLOROTHIAZIDE 12.5 MG PO CAPS: 12.5000 mg | ORAL_CAPSULE | Freq: Every day | ORAL | 1 refills | Status: DC

## 2022-01-05 NOTE — Telephone Encounter (Signed)
Patient notified of results and recommendations and agreed to plan. Rx sent. Lab order on file. Patient will be stopping by next week at [place].

## 2022-01-05 NOTE — Telephone Encounter (Signed)
-----   Message from [name], MD sent at 12/25/2021 12:19 PM EST ----- Chem-7 is fine.  Please add hydrochlorothiazide 12.5 daily to medical regiment, Chem-7 need to be repeated within the next week

## 2022-01-07 ENCOUNTER — Telehealth: Payer: Self-pay | Admitting: *Deleted

## 2022-01-07 NOTE — Telephone Encounter (Signed)
Patient notified of sleep study results and recommendations. He agrees to proceed with BIPAP treatment.orders sent to [facility].

## 2022-01-07 NOTE — Telephone Encounter (Signed)
-----   Message from Lennette Bihari, MD sent at 01/01/2022  9:56 AM EST ----- Burna Mortimer, please notify pt and set up with DME for BiPAP initiation.

## 2022-01-26 ENCOUNTER — Ambulatory Visit: Payer: BC Managed Care – PPO | Admitting: Family

## 2022-01-26 ENCOUNTER — Encounter: Payer: Self-pay | Admitting: Family

## 2022-01-26 ENCOUNTER — Telehealth: Payer: Self-pay | Admitting: Family

## 2022-01-26 VITALS — BP 120/78 | HR 72 | Temp 98.6°F | Resp 16 | Wt 320.0 lb

## 2022-01-26 DIAGNOSIS — I10 Essential (primary) hypertension: Secondary | ICD-10-CM | POA: Diagnosis not present

## 2022-01-26 DIAGNOSIS — E1169 Type 2 diabetes mellitus with other specified complication: Secondary | ICD-10-CM | POA: Diagnosis not present

## 2022-01-26 DIAGNOSIS — G4733 Obstructive sleep apnea (adult) (pediatric): Secondary | ICD-10-CM | POA: Diagnosis not present

## 2022-01-26 DIAGNOSIS — E785 Hyperlipidemia, unspecified: Secondary | ICD-10-CM

## 2022-01-26 DIAGNOSIS — E669 Obesity, unspecified: Secondary | ICD-10-CM | POA: Diagnosis not present

## 2022-01-26 LAB — BASIC METABOLIC PANEL
BUN: 17 mg/dL (ref 6–23)
CO2: 31 mEq/L (ref 19–32)
Calcium: 9.2 mg/dL (ref 8.4–10.5)
Chloride: 98 mEq/L (ref 96–112)
Creatinine, Ser: 1.08 mg/dL (ref 0.40–1.50)
GFR: 78.39 mL/min (ref >60.00)
Glucose, Bld: 151 mg/dL — ABNORMAL HIGH (ref 70–99)
Potassium: 4.6 mEq/L (ref 3.5–5.1)
Sodium: 136 mEq/L (ref 135–145)

## 2022-01-26 LAB — HEMOGLOBIN A1C: Hgb A1c MFr Bld: 8.3 % — ABNORMAL HIGH (ref 4.6–6.5)

## 2022-01-26 NOTE — Progress Notes (Signed)
Subjective:     Patient ID: Angel Merritt, male    DOB: [birth year], 56 y.o.   MRN: [id number]  Chief Complaint  Patient presents with   Diabetes    Here for follow up   Hypertension    Here for follow up    Diabetes  Hypertension  Patient is in today for follow up.  DM2- Reports some improvement in his diet.  Lab Results  Component Value Date   HGBA1C 7.5 (H) 09/09/2021   Lab Results  Component Value Date   MICROALBUR 0.8 10/23/2021   LDLCALC 95 11/24/2021   CREATININE 0.95 12/21/2021   HTN- currently maintained on hctz 12.5mg , lopressor 25mg .  BP Readings from Last 3 Encounters:  01/26/22 120/78  12/21/21 130/76  12/08/21 122/84   Morbid obesity-  Wt Readings from Last 3 Encounters:  01/26/22 (!) 320 lb (145.2 kg)  12/21/21 (!) 326 lb (147.9 kg)  12/08/21 (!) 320 lb (145.2 kg)   He is working with cardiology and they are evaluating him for V tack.  Echo was strongly recommended but he has been unable to afford it.   OSA- was prescribed bipap. He has not heard back from the DME company that cardiology ordered supplies from.   There are no preventive care reminders to display for this patient.   Past Medical History:  Diagnosis Date   B12 deficiency 09/07/2020   Diabetes mellitus type 2, controlled (HCC) 09/09/2021   ED (erectile dysfunction)    GERD (gastroesophageal reflux disease)    Hyperlipidemia    OA (osteoarthritis)    OSA (obstructive sleep apnea) 10/15/2021   Umbilical hernia    Varicose veins    Wears glasses     Past Surgical History:  Procedure Laterality Date   COLONOSCOPY WITH PROPOFOL  11-07-2020  dr nandigam   ENDOVENOUS ABLATION SAPHENOUS VEIN W/ LASER Left 11-06-2015   endovenous laser ablation left greater saphenous vein and stab phlebectomies > 20 incisions  left leg  by Gretta Began MD   UMBILICAL HERNIA REPAIR N/A 11/21/2020   Procedure: LAPAROSCOPIC UMBILICAL HERNIA WITH MESH;  Surgeon: Axel Filler, MD;  Location: [facility]  [place];  Service: General;  Laterality: N/A;    Family History  Problem Relation Age of Onset   Colon cancer Father 69       died at 73 (following a hernia repair)   Cancer Father    Coronary artery disease Mother 73       (has 3 stents)   Heart disease Mother    Cancer Paternal Grandfather        brain tumor   Coronary artery disease Maternal Grandmother 76       smoker   Esophageal cancer Neg Hx    Stomach cancer Neg Hx    Rectal cancer Neg Hx     Social History   Socioeconomic History   Marital status: Married    Spouse name: Not on file   Number of children: Not on file   Years of education: Not on file   Highest education level: Not on file  Occupational History   Not on file  Tobacco Use   Smoking status: Never   Smokeless tobacco: Never  Vaping Use   Vaping Use: Never used  Substance and Sexual Activity   Alcohol use: Not Currently   Drug use: Never   Sexual activity: Not on file    Comment: vasectomy  2017  Other Topics Concern   Not on file  Social History Narrative   4 children- 38 (daughter), 69 (daughter), 8 son, 5 son   Works as Curator   Married   Enjoys baseball, golf, gym   Social Determinants of Corporate investment banker Strain: Not on [employer] Insecurity: Not on Chartered certified accountant Needs: Not on file  Physical Activity: Not on file  Stress: Not on file  Social Connections: Not on file  Intimate Partner Violence: Not on file    Outpatient Medications Prior to Visit  Medication Sig Dispense Refill   famotidine (PEPCID) 10 MG tablet Take 10 mg by mouth as needed for heartburn or indigestion.     hydrochlorothiazide (MICROZIDE) 12.5 MG capsule Take 1 capsule (12.5 mg total) by mouth daily. 30 capsule 1   ibuprofen (ADVIL,MOTRIN) 200 MG tablet Take 200 mg by mouth as needed for headache, mild pain or moderate pain.     metoprolol tartrate (LOPRESSOR) 25 MG tablet Take 1 tablet (25 mg total) by mouth 2 (two) times daily.  180 tablet 1   rosuvastatin (CRESTOR) 20 MG tablet Take 1 tablet (20 mg total) by mouth daily. 90 tablet 3   No facility-administered medications prior to visit.    Allergies  Allergen Reactions   Effexor [Venlafaxine] Other (See Comments)    Severe daytime somnolence, head ache and weight gain   Lexapro [Escitalopram Oxalate]     Weight gain    ROS    See HPI Objective:    Physical Exam Constitutional:      General: He is not in acute distress.    Appearance: He is well-developed.  HENT:     Head: Normocephalic and atraumatic.  Cardiovascular:     Rate and Rhythm: Normal rate and regular rhythm.     Heart sounds: No murmur heard. Pulmonary:     Effort: Pulmonary effort is normal. No respiratory distress.     Breath sounds: Normal breath sounds. No wheezing or rales.  Skin:    General: Skin is warm and dry.  Neurological:     Mental Status: He is alert and oriented to person, place, and time.  Psychiatric:        Behavior: Behavior normal.        Thought Content: Thought content normal.    BP 120/78 (BP Location: Right Arm, Patient Position: Sitting, Cuff Size: Large)    Pulse 72    Temp 98.6 F (37 C) (Oral)    Resp 16    Wt (!) 320 lb (145.2 kg)    SpO2 98%    BMI 43.40 kg/m  Wt Readings from Last 3 Encounters:  01/26/22 (!) 320 lb (145.2 kg)  12/21/21 (!) 326 lb (147.9 kg)  12/08/21 (!) 320 lb (145.2 kg)       Assessment & Plan:   Problem List Items Addressed This Visit       Unprioritized   OSA (obstructive sleep apnea)    He has not yet heard back from the DME company. I sent a note to the cardiology team notifying them of this.       Essential hypertension    BP at goal. Continue current medications. Obtain follow up bmet.       Dyslipidemia    Lab Results  Component Value Date   CHOL 155 11/24/2021   HDL 35 (L) 11/24/2021   LDLCALC 95 11/24/2021   TRIG 142 11/24/2021   CHOLHDL 4.4 11/24/2021  LDL at goal. Continue crestor.  Diabetes mellitus type 2 in obese (HCC) - Primary    Obtain follow up A1C.  Will see where he is at. We did discuss the possibility of a GLP-1 as well as common side effects and he is open to the idea for weight loss and glycemic control.       Relevant Orders   Hemoglobin A1c   Basic metabolic panel    I am having Liston Alba "[name]" maintain his ibuprofen, famotidine, metoprolol tartrate, rosuvastatin, and hydrochlorothiazide.  No orders of the defined types were placed in this encounter.

## 2022-01-26 NOTE — Assessment & Plan Note (Signed)
BP at goal. Continue current medications. Obtain follow up bmet.

## 2022-01-26 NOTE — Assessment & Plan Note (Signed)
He has not yet heard back from the DME company. I sent a note to the cardiology team notifying them of this.

## 2022-01-26 NOTE — Assessment & Plan Note (Signed)
Lab Results  Component Value Date   CHOL 155 11/24/2021   HDL 35 (L) 11/24/2021   LDLCALC 95 11/24/2021   TRIG 142 11/24/2021   CHOLHDL 4.4 11/24/2021   LDL at goal. Continue crestor.

## 2022-01-26 NOTE — Telephone Encounter (Signed)
-----   Message from Gaynelle Cage, CMA sent at 01/26/2022  2:45 PM EST ----- Hello, The order was just sent on 01/12/22. It was explained to him that they have to get this approved by his insurance and due to the current back order it can take up to 2 months as they are trying to catch up on the back log.  ----- Message ----- From: Sandford Craze, NP Sent: 01/26/2022   7:15 AM EST To: Gaynelle Cage, CMA  Hello,  I saw [name] today and he told me that he has not yet been contacted for his BIPAP.    Thanks,  [employer]

## 2022-01-26 NOTE — Patient Instructions (Signed)
Please complete lab work prior to leaving.   

## 2022-01-26 NOTE — Assessment & Plan Note (Signed)
Obtain follow up A1C.  Will see where he is at. We did discuss the possibility of a GLP-1 as well as common side effects and he is open to the idea for weight loss and glycemic control.

## 2022-01-27 ENCOUNTER — Telehealth: Payer: Self-pay | Admitting: Family

## 2022-01-27 ENCOUNTER — Other Ambulatory Visit: Payer: Self-pay | Admitting: Cardiology

## 2022-01-27 MED ORDER — BLOOD GLUCOSE MONITOR KIT: PACK | 0 refills | Status: DC

## 2022-01-27 MED ORDER — OZEMPIC (0.25 OR 0.5 MG/DOSE) 2 MG/1.5ML ~~LOC~~ SOPN: PEN_INJECTOR | SUBCUTANEOUS | 0 refills | Status: DC

## 2022-01-27 NOTE — Telephone Encounter (Signed)
A1C has risen and is above goal at 8.3.  I would like for him to try Ozempic injections. I have sent rx to [facility]. If he is able to get the prescription covered, please schedule a nurse visit and we can help him learn how to inject it.   I also sent an rx for a glucometer with strips/lancets to his pharmacy. He can also bring that with him to nurse visit.

## 2022-01-27 NOTE — Telephone Encounter (Signed)
Lvm to call back

## 2022-01-27 NOTE — Telephone Encounter (Signed)
Patient scheduled for tomorrow at 4pm.  Advised to bring in ozempic and glucometer for training.  Patient unable to come during regular nv times and could not get here until 4.

## 2022-01-28 ENCOUNTER — Ambulatory Visit: Payer: BC Managed Care – PPO

## 2022-01-28 DIAGNOSIS — E669 Obesity, unspecified: Secondary | ICD-10-CM

## 2022-01-28 DIAGNOSIS — E1169 Type 2 diabetes mellitus with other specified complication: Secondary | ICD-10-CM

## 2022-01-28 MED ORDER — ONETOUCH DELICA LANCETS 30G MISC: 1 refills | Status: DC

## 2022-01-28 NOTE — Telephone Encounter (Addendum)
Pt has come to the appointment for med teaching today, however he did not have his ozempic and has not taken any other medication for his sugar yet.   He only got test strips from the pharmacy, so I gave him one of our sample boxes and Amber and I showed him how to use the glucometer.   We have sent him some lancets and will start taking his blood sugar in the morning before eating and a couple hours after dinner to start. He verbalized understanding and checked his sugar  (206) today after eating at noon.   I have called the pharmacy and a PA is needed for the Ozempic.

## 2022-01-28 NOTE — Progress Notes (Signed)
Pt has come in glucose education. He will check his sugar twice daily to start.  Demonstrated and walked through proper glucose check and gave pt sample of Verio flex one touch.  Pt verbalized understanding.   Pt did not have Ozempic with him and has not taken any blood sugar management medication previously.    Message send to provider for guidance and we will call back back with recommendation.

## 2022-02-04 ENCOUNTER — Telehealth: Payer: Self-pay

## 2022-02-04 NOTE — Telephone Encounter (Signed)
-----   Message from [name], MD sent at 02/01/2022 12:41 PM EST ----- ?Chem-7 looks good, continue present management ?

## 2022-02-04 NOTE — Telephone Encounter (Signed)
We have tried to reach out to the patient regarding the results below. As a last attempt, a letter requesting a call back mailed out to the patient.  ?

## 2022-02-18 ENCOUNTER — Other Ambulatory Visit: Payer: Self-pay | Admitting: Family

## 2022-03-16 ENCOUNTER — Ambulatory Visit: Payer: BC Managed Care – PPO | Admitting: Cardiology

## 2022-03-28 NOTE — Progress Notes (Deleted)
PCP:  Debbrah Alar, NP Primary Cardiologist: None Electrophysiologist: None   Angel Merritt is a 56 y.o. male seen today for None for routine electrophysiology followup.  Since last being seen in our clinic the patient reports doing ***.  he denies chest pain, palpitations, dyspnea, PND, orthopnea, nausea, vomiting, dizziness, syncope, edema, weight gain, or early satiety.  Past Medical History:  Diagnosis Date   B12 deficiency 09/07/2020   Diabetes mellitus type 2, controlled ([place]) 09/09/2021   ED (erectile dysfunction)    GERD (gastroesophageal reflux disease)    Hyperlipidemia    OA (osteoarthritis)    OSA (obstructive sleep apnea) 43/88/8757   Umbilical hernia    Varicose veins    Wears glasses    Past Surgical History:  Procedure Laterality Date   COLONOSCOPY WITH PROPOFOL  11-07-2020  dr nandigam   ENDOVENOUS ABLATION SAPHENOUS VEIN W/ LASER Left 11-06-2015   endovenous laser ablation left greater saphenous vein and stab phlebectomies > 20 incisions  left leg  by Curt Jews MD   UMBILICAL HERNIA REPAIR N/A 11/21/2020   Procedure: LAPAROSCOPIC UMBILICAL HERNIA WITH MESH;  Surgeon: Ralene Ok, MD;  Location: [facility];  Service: General;  Laterality: N/A;    Current Outpatient Medications  Medication Sig Dispense Refill   Blood Glucose Monitoring Suppl (CONTOUR NEXT MONITOR) w/Device KIT Check blood sugars daily 1 kit 0   famotidine (PEPCID) 10 MG tablet Take 10 mg by mouth as needed for heartburn or indigestion.     hydrochlorothiazide (MICROZIDE) 12.5 MG capsule Take 1 capsule (12.5 mg total) by mouth daily. 90 capsule 2   ibuprofen (ADVIL,MOTRIN) 200 MG tablet Take 200 mg by mouth as needed for headache, mild pain or moderate pain.     metoprolol tartrate (LOPRESSOR) 25 MG tablet Take 1 tablet (25 mg total) by mouth 2 (two) times daily. 180 tablet 1   rosuvastatin (CRESTOR) 20 MG tablet Take 1 tablet (20 mg total) by mouth daily. 90 tablet 3    Semaglutide,0.25 or 0.5MG/DOS, (OZEMPIC, 0.25 OR 0.5 MG/DOSE,) 2 MG/1.5ML SOPN Inject 0.25 mg into the skin once weekly for 4 weeks, then increase to 0.5 mg once weekly. 6 mL 0   No current facility-administered medications for this visit.    Allergies  Allergen Reactions   Effexor [Venlafaxine] Other (See Comments)    Severe daytime somnolence, head ache and weight gain   Lexapro [Escitalopram Oxalate]     Weight gain    Social History   Socioeconomic History   Marital status: Married    Spouse name: Not on file   Number of children: Not on file   Years of education: Not on file   Highest education level: Not on file  Occupational History   Not on file  Tobacco Use   Smoking status: Never   Smokeless tobacco: Never  Vaping Use   Vaping Use: Never used  Substance and Sexual Activity   Alcohol use: Not Currently   Drug use: Never   Sexual activity: Not on file    Comment: vasectomy  2017  Other Topics Concern   Not on file  Social History Narrative   4 children- 28 (daughter), 70 (daughter), 8 son, 5 son   Works as Dealer   Married   Enjoys baseball, golf, gym   Social Determinants of Radio broadcast assistant Strain: Not on file  Food Insecurity: Not on file  Transportation Needs: Not on file  Physical Activity: Not on file  Stress: Not on file  Social Connections: Not on file  Intimate Partner Violence: Not on file     Review of Systems: All other systems reviewed and are otherwise negative except as noted above.  Physical Exam: There were no vitals filed for this visit.  GEN- The patient is well appearing, alert and oriented x 3 today.   HEENT: normocephalic, atraumatic; sclera clear, conjunctiva pink; hearing intact; oropharynx clear; neck supple, no JVP Lymph- no cervical lymphadenopathy Lungs- Clear to ausculation bilaterally, normal work of breathing.  No wheezes, rales, rhonchi Heart- Regular rate and rhythm, no murmurs, rubs or gallops, PMI  not laterally displaced GI- soft, non-tender, non-distended, bowel sounds present, no hepatosplenomegaly Extremities- no clubbing, cyanosis, or edema; DP/PT/radial pulses 2+ bilaterally MS- no significant deformity or atrophy Skin- warm and dry, no rash or lesion Psych- euthymic mood, full affect Neuro- strength and sensation are intact  EKG is ordered. Personal review of EKG from today shows ***  Additional studies reviewed include: Previous EP office notes.   Assessment and Plan:  1. PVCs *** Continue metoprolol  2. Sleep disordered breathing *** sleep study.   Follow up with {Blank single:19197::"Dr. Allred","Dr. Arlan Organ. Klein","Dr. Camnitz","Dr. Lambert","EP APP"} in {Blank single:19197::"2 weeks","4 weeks","3 months","6 months","12 months","as usual post gen change"}   Shirley Friar, [place]  03/28/22 2:55 PM

## 2022-04-01 ENCOUNTER — Ambulatory Visit: Payer: BC Managed Care – PPO | Admitting: Student

## 2022-04-08 NOTE — Progress Notes (Deleted)
PCP:  Debbrah Alar, NP Primary Cardiologist: None Electrophysiologist: None   Angel Merritt is a 56 y.o. male seen today for None for routine electrophysiology followup.  Since last being seen in our clinic the patient reports doing ***.  he denies chest pain, palpitations, dyspnea, PND, orthopnea, nausea, vomiting, dizziness, syncope, edema, weight gain, or early satiety.  Past Medical History:  Diagnosis Date   B12 deficiency 09/07/2020   Diabetes mellitus type 2, controlled ([place]) 09/09/2021   ED (erectile dysfunction)    GERD (gastroesophageal reflux disease)    Hyperlipidemia    OA (osteoarthritis)    OSA (obstructive sleep apnea) 43/88/8757   Umbilical hernia    Varicose veins    Wears glasses    Past Surgical History:  Procedure Laterality Date   COLONOSCOPY WITH PROPOFOL  11-07-2020  dr nandigam   ENDOVENOUS ABLATION SAPHENOUS VEIN W/ LASER Left 11-06-2015   endovenous laser ablation left greater saphenous vein and stab phlebectomies > 20 incisions  left leg  by Curt Jews MD   UMBILICAL HERNIA REPAIR N/A 11/21/2020   Procedure: LAPAROSCOPIC UMBILICAL HERNIA WITH MESH;  Surgeon: Ralene Ok, MD;  Location: [facility];  Service: General;  Laterality: N/A;    Current Outpatient Medications  Medication Sig Dispense Refill   Blood Glucose Monitoring Suppl (CONTOUR NEXT MONITOR) w/Device KIT Check blood sugars daily 1 kit 0   famotidine (PEPCID) 10 MG tablet Take 10 mg by mouth as needed for heartburn or indigestion.     hydrochlorothiazide (MICROZIDE) 12.5 MG capsule Take 1 capsule (12.5 mg total) by mouth daily. 90 capsule 2   ibuprofen (ADVIL,MOTRIN) 200 MG tablet Take 200 mg by mouth as needed for headache, mild pain or moderate pain.     metoprolol tartrate (LOPRESSOR) 25 MG tablet Take 1 tablet (25 mg total) by mouth 2 (two) times daily. 180 tablet 1   rosuvastatin (CRESTOR) 20 MG tablet Take 1 tablet (20 mg total) by mouth daily. 90 tablet 3    Semaglutide,0.25 or 0.5MG/DOS, (OZEMPIC, 0.25 OR 0.5 MG/DOSE,) 2 MG/1.5ML SOPN Inject 0.25 mg into the skin once weekly for 4 weeks, then increase to 0.5 mg once weekly. 6 mL 0   No current facility-administered medications for this visit.    Allergies  Allergen Reactions   Effexor [Venlafaxine] Other (See Comments)    Severe daytime somnolence, head ache and weight gain   Lexapro [Escitalopram Oxalate]     Weight gain    Social History   Socioeconomic History   Marital status: Married    Spouse name: Not on file   Number of children: Not on file   Years of education: Not on file   Highest education level: Not on file  Occupational History   Not on file  Tobacco Use   Smoking status: Never   Smokeless tobacco: Never  Vaping Use   Vaping Use: Never used  Substance and Sexual Activity   Alcohol use: Not Currently   Drug use: Never   Sexual activity: Not on file    Comment: vasectomy  2017  Other Topics Concern   Not on file  Social History Narrative   4 children- 28 (daughter), 70 (daughter), 8 son, 5 son   Works as Dealer   Married   Enjoys baseball, golf, gym   Social Determinants of Radio broadcast assistant Strain: Not on file  Food Insecurity: Not on file  Transportation Needs: Not on file  Physical Activity: Not on file  Stress: Not on file  Social Connections: Not on file  Intimate Partner Violence: Not on file     Review of Systems: All other systems reviewed and are otherwise negative except as noted above.  Physical Exam: There were no vitals filed for this visit.  GEN- The patient is well appearing, alert and oriented x 3 today.   HEENT: normocephalic, atraumatic; sclera clear, conjunctiva pink; hearing intact; oropharynx clear; neck supple, no JVP Lymph- no cervical lymphadenopathy Lungs- Clear to ausculation bilaterally, normal work of breathing.  No wheezes, rales, rhonchi Heart- Regular rate and rhythm, no murmurs, rubs or gallops, PMI  not laterally displaced GI- soft, non-tender, non-distended, bowel sounds present, no hepatosplenomegaly Extremities- no clubbing, cyanosis, or edema; DP/PT/radial pulses 2+ bilaterally MS- no significant deformity or atrophy Skin- warm and dry, no rash or lesion Psych- euthymic mood, full affect Neuro- strength and sensation are intact  EKG is ordered. Personal review of EKG from today shows ***  Additional studies reviewed include: Previous EP office notes.   Assessment and Plan:  1. PVCs *** Continue metoprolol  2. Sleep disordered breathing *** sleep study.   Follow up with {Blank single:19197::"Dr. Allred","Dr. Arlan Organ. Klein","Dr. Camnitz","Dr. Lambert","EP APP"} in {Blank single:19197::"2 weeks","4 weeks","3 months","6 months","12 months","as usual post gen change"}   Shirley Friar, [place]  04/08/22 9:23 AM

## 2022-04-15 ENCOUNTER — Ambulatory Visit: Payer: BC Managed Care – PPO | Admitting: Student

## 2022-04-26 ENCOUNTER — Ambulatory Visit: Payer: BC Managed Care – PPO | Admitting: Family

## 2022-04-27 ENCOUNTER — Ambulatory Visit: Payer: BC Managed Care – PPO | Admitting: Student

## 2022-04-27 DIAGNOSIS — G4730 Sleep apnea, unspecified: Secondary | ICD-10-CM

## 2022-04-27 DIAGNOSIS — I493 Ventricular premature depolarization: Secondary | ICD-10-CM

## 2022-05-04 NOTE — Progress Notes (Deleted)
PCP:  Debbrah Alar, NP Primary Cardiologist: None Electrophysiologist: Will Meredith Leeds, MD   Angel Merritt is a 56 y.o. male seen today for Will Meredith Leeds, MD for routine electrophysiology followup.  Since last being seen in our clinic the patient reports doing ***.  he denies chest pain, palpitations, dyspnea, PND, orthopnea, nausea, vomiting, dizziness, syncope, edema, weight gain, or early satiety.  Past Medical History:  Diagnosis Date   B12 deficiency 09/07/2020   Diabetes mellitus type 2, controlled ([place]) 09/09/2021   ED (erectile dysfunction)    GERD (gastroesophageal reflux disease)    Hyperlipidemia    OA (osteoarthritis)    OSA (obstructive sleep apnea) 22/24/1146   Umbilical hernia    Varicose veins    Wears glasses    Past Surgical History:  Procedure Laterality Date   COLONOSCOPY WITH PROPOFOL  11-07-2020  dr nandigam   ENDOVENOUS ABLATION SAPHENOUS VEIN W/ LASER Left 11-06-2015   endovenous laser ablation left greater saphenous vein and stab phlebectomies > 20 incisions  left leg  by Curt Jews MD   UMBILICAL HERNIA REPAIR N/A 11/21/2020   Procedure: LAPAROSCOPIC UMBILICAL HERNIA WITH MESH;  Surgeon: Ralene Ok, MD;  Location: [facility];  Service: General;  Laterality: N/A;    Current Outpatient Medications  Medication Sig Dispense Refill   Blood Glucose Monitoring Suppl (CONTOUR NEXT MONITOR) w/Device KIT Check blood sugars daily 1 kit 0   famotidine (PEPCID) 10 MG tablet Take 10 mg by mouth as needed for heartburn or indigestion.     hydrochlorothiazide (MICROZIDE) 12.5 MG capsule Take 1 capsule (12.5 mg total) by mouth daily. 90 capsule 2   ibuprofen (ADVIL,MOTRIN) 200 MG tablet Take 200 mg by mouth as needed for headache, mild pain or moderate pain.     metoprolol tartrate (LOPRESSOR) 25 MG tablet Take 1 tablet (25 mg total) by mouth 2 (two) times daily. 180 tablet 1   rosuvastatin (CRESTOR) 20 MG tablet Take 1 tablet (20  mg total) by mouth daily. 90 tablet 3   Semaglutide,0.25 or 0.5MG/DOS, (OZEMPIC, 0.25 OR 0.5 MG/DOSE,) 2 MG/1.5ML SOPN Inject 0.25 mg into the skin once weekly for 4 weeks, then increase to 0.5 mg once weekly. 6 mL 0   No current facility-administered medications for this visit.    Allergies  Allergen Reactions   Effexor [Venlafaxine] Other (See Comments)    Severe daytime somnolence, head ache and weight gain   Lexapro [Escitalopram Oxalate]     Weight gain    Social History   Socioeconomic History   Marital status: Married    Spouse name: Not on file   Number of children: Not on file   Years of education: Not on file   Highest education level: Not on file  Occupational History   Not on file  Tobacco Use   Smoking status: Never   Smokeless tobacco: Never  Vaping Use   Vaping Use: Never used  Substance and Sexual Activity   Alcohol use: Not Currently   Drug use: Never   Sexual activity: Not on file    Comment: vasectomy  2017  Other Topics Concern   Not on file  Social History Narrative   4 children- 67 (daughter), 34 (daughter), 8 son, 5 son   Works as Dealer   Married   Enjoys baseball, golf, gym   Social Determinants of Radio broadcast assistant Strain: Not on file  Food Insecurity: Not on file  Transportation Needs: Not on file  Physical Activity: Not on file  Stress: Not on file  Social Connections: Not on file  Intimate Partner Violence: Not on file     Review of Systems: All other systems reviewed and are otherwise negative except as noted above.  Physical Exam: There were no vitals filed for this visit.  GEN- The patient is well appearing, alert and oriented x 3 today.   HEENT: normocephalic, atraumatic; sclera clear, conjunctiva pink; hearing intact; oropharynx clear; neck supple, no JVP Lymph- no cervical lymphadenopathy Lungs- Clear to ausculation bilaterally, normal work of breathing.  No wheezes, rales, rhonchi Heart- Regular rate and  rhythm, no murmurs, rubs or gallops, PMI not laterally displaced GI- soft, non-tender, non-distended, bowel sounds present, no hepatosplenomegaly Extremities- no clubbing, cyanosis, or edema; DP/PT/radial pulses 2+ bilaterally MS- no significant deformity or atrophy Skin- warm and dry, no rash or lesion Psych- euthymic mood, full affect Neuro- strength and sensation are intact  EKG is ordered. Personal review of EKG from today shows ***  Additional studies reviewed include: Previous EP office notes.   Assessment and Plan:  1. PVCs *** Continue metoprolol Needs echo prior to considered AAD  2. Sleep disordered breathing *** sleep study.   Follow up with {Blank single:19197::"Dr. Allred","Dr. Arlan Organ. Klein","Dr. Camnitz","Dr. Lambert","EP APP"} in {Blank single:19197::"2 weeks","4 weeks","3 months","6 months","12 months","as usual post gen change"}   Angel Merritt, [place]  05/04/22 1:53 PM

## 2022-05-10 ENCOUNTER — Ambulatory Visit: Payer: BC Managed Care – PPO | Admitting: Family

## 2022-05-11 ENCOUNTER — Ambulatory Visit: Payer: BC Managed Care – PPO | Admitting: Student

## 2022-05-14 ENCOUNTER — Other Ambulatory Visit: Payer: Self-pay | Admitting: Cardiology

## 2022-05-14 NOTE — Telephone Encounter (Signed)
Metoprolol Tartrate 25 mg # 180 x 1 refill sent to  [id number] - [place], [place] - [address]

## 2022-05-17 NOTE — Progress Notes (Deleted)
PCP:  Debbrah Alar, NP Primary Cardiologist: None Electrophysiologist: Will Meredith Leeds, MD   Angel Merritt is a 56 y.o. male seen today for Will Meredith Leeds, MD for routine electrophysiology followup.  Since last being seen in our clinic the patient reports doing ***.  he denies chest pain, palpitations, dyspnea, PND, orthopnea, nausea, vomiting, dizziness, syncope, edema, weight gain, or early satiety.  Past Medical History:  Diagnosis Date   B12 deficiency 09/07/2020   Diabetes mellitus type 2, controlled ([place]) 09/09/2021   ED (erectile dysfunction)    GERD (gastroesophageal reflux disease)    Hyperlipidemia    OA (osteoarthritis)    OSA (obstructive sleep apnea) 32/35/5732   Umbilical hernia    Varicose veins    Wears glasses    Past Surgical History:  Procedure Laterality Date   COLONOSCOPY WITH PROPOFOL  11-07-2020  dr nandigam   ENDOVENOUS ABLATION SAPHENOUS VEIN W/ LASER Left 11-06-2015   endovenous laser ablation left greater saphenous vein and stab phlebectomies > 20 incisions  left leg  by Curt Jews MD   UMBILICAL HERNIA REPAIR N/A 11/21/2020   Procedure: LAPAROSCOPIC UMBILICAL HERNIA WITH MESH;  Surgeon: Ralene Ok, MD;  Location: [facility];  Service: General;  Laterality: N/A;    Current Outpatient Medications  Medication Sig Dispense Refill   Blood Glucose Monitoring Suppl (CONTOUR NEXT MONITOR) w/Device KIT Check blood sugars daily 1 kit 0   famotidine (PEPCID) 10 MG tablet Take 10 mg by mouth as needed for heartburn or indigestion.     hydrochlorothiazide (MICROZIDE) 12.5 MG capsule Take 1 capsule (12.5 mg total) by mouth daily. 90 capsule 2   ibuprofen (ADVIL,MOTRIN) 200 MG tablet Take 200 mg by mouth as needed for headache, mild pain or moderate pain.     metoprolol tartrate (LOPRESSOR) 25 MG tablet TAKE 1 TABLET BY MOUTH TWICE A DAY 180 tablet 1   rosuvastatin (CRESTOR) 20 MG tablet Take 1 tablet (20 mg total) by mouth  daily. 90 tablet 3   Semaglutide,0.25 or 0.5MG/DOS, (OZEMPIC, 0.25 OR 0.5 MG/DOSE,) 2 MG/1.5ML SOPN Inject 0.25 mg into the skin once weekly for 4 weeks, then increase to 0.5 mg once weekly. 6 mL 0   No current facility-administered medications for this visit.    Allergies  Allergen Reactions   Effexor [Venlafaxine] Other (See Comments)    Severe daytime somnolence, head ache and weight gain   Lexapro [Escitalopram Oxalate]     Weight gain    Social History   Socioeconomic History   Marital status: Married    Spouse name: Not on file   Number of children: Not on file   Years of education: Not on file   Highest education level: Not on file  Occupational History   Not on file  Tobacco Use   Smoking status: Never   Smokeless tobacco: Never  Vaping Use   Vaping Use: Never used  Substance and Sexual Activity   Alcohol use: Not Currently   Drug use: Never   Sexual activity: Not on file    Comment: vasectomy  2017  Other Topics Concern   Not on file  Social History Narrative   4 children- 78 (daughter), 38 (daughter), 8 son, 5 son   Works as Dealer   Married   Enjoys baseball, golf, gym   Social Determinants of Radio broadcast assistant Strain: Not on file  Food Insecurity: Not on file  Transportation Needs: Not on file  Physical Activity: Not on  file  Stress: Not on file  Social Connections: Not on file  Intimate Partner Violence: Not on file     Review of Systems: All other systems reviewed and are otherwise negative except as noted above.  Physical Exam: There were no vitals filed for this visit.  GEN- The patient is well appearing, alert and oriented x 3 today.   HEENT: normocephalic, atraumatic; sclera clear, conjunctiva pink; hearing intact; oropharynx clear; neck supple, no JVP Lymph- no cervical lymphadenopathy Lungs- Clear to ausculation bilaterally, normal work of breathing.  No wheezes, rales, rhonchi Heart- Regular rate and rhythm, no murmurs,  rubs or gallops, PMI not laterally displaced GI- soft, non-tender, non-distended, bowel sounds present, no hepatosplenomegaly Extremities- no clubbing, cyanosis, or edema; DP/PT/radial pulses 2+ bilaterally MS- no significant deformity or atrophy Skin- warm and dry, no rash or lesion Psych- euthymic mood, full affect Neuro- strength and sensation are intact  EKG is ordered. Personal review of EKG from today shows ***  Additional studies reviewed include: Previous EP office notes.   Assessment and Plan:  1. PVCs *** Continue metoprolol Needs echo prior to considered AAD  2. Sleep disordered breathing *** sleep study.   Follow up with {Blank single:19197::"Dr. Allred","Dr. Arlan Organ. Klein","Dr. Camnitz","Dr. Lambert","EP APP"} in {Blank single:19197::"2 weeks","4 weeks","3 months","6 months","12 months","as usual post gen change"}   Shirley Friar, [place]  05/17/22 8:19 AM

## 2022-05-21 ENCOUNTER — Ambulatory Visit: Payer: BC Managed Care – PPO | Admitting: Family

## 2022-05-24 ENCOUNTER — Ambulatory Visit: Payer: BC Managed Care – PPO | Admitting: Student

## 2022-05-24 DIAGNOSIS — I493 Ventricular premature depolarization: Secondary | ICD-10-CM

## 2022-05-24 DIAGNOSIS — I10 Essential (primary) hypertension: Secondary | ICD-10-CM

## 2022-05-24 DIAGNOSIS — G4730 Sleep apnea, unspecified: Secondary | ICD-10-CM

## 2022-06-14 NOTE — Progress Notes (Deleted)
PCP:  Debbrah Alar, NP Primary Cardiologist: None Electrophysiologist: Will Meredith Leeds, MD   Angel Merritt is a 56 y.o. male seen today for Will Meredith Leeds, MD for routine electrophysiology followup.  Since last being seen in our clinic the patient reports doing ***.  he denies chest pain, palpitations, dyspnea, PND, orthopnea, nausea, vomiting, dizziness, syncope, edema, weight gain, or early satiety.  Past Medical History:  Diagnosis Date   B12 deficiency 09/07/2020   Diabetes mellitus type 2, controlled ([place]) 09/09/2021   ED (erectile dysfunction)    GERD (gastroesophageal reflux disease)    Hyperlipidemia    OA (osteoarthritis)    OSA (obstructive sleep apnea) 32/35/5732   Umbilical hernia    Varicose veins    Wears glasses    Past Surgical History:  Procedure Laterality Date   COLONOSCOPY WITH PROPOFOL  11-07-2020  dr nandigam   ENDOVENOUS ABLATION SAPHENOUS VEIN W/ LASER Left 11-06-2015   endovenous laser ablation left greater saphenous vein and stab phlebectomies > 20 incisions  left leg  by Curt Jews MD   UMBILICAL HERNIA REPAIR N/A 11/21/2020   Procedure: LAPAROSCOPIC UMBILICAL HERNIA WITH MESH;  Surgeon: Ralene Ok, MD;  Location: [facility];  Service: General;  Laterality: N/A;    Current Outpatient Medications  Medication Sig Dispense Refill   Blood Glucose Monitoring Suppl (CONTOUR NEXT MONITOR) w/Device KIT Check blood sugars daily 1 kit 0   famotidine (PEPCID) 10 MG tablet Take 10 mg by mouth as needed for heartburn or indigestion.     hydrochlorothiazide (MICROZIDE) 12.5 MG capsule Take 1 capsule (12.5 mg total) by mouth daily. 90 capsule 2   ibuprofen (ADVIL,MOTRIN) 200 MG tablet Take 200 mg by mouth as needed for headache, mild pain or moderate pain.     metoprolol tartrate (LOPRESSOR) 25 MG tablet TAKE 1 TABLET BY MOUTH TWICE A DAY 180 tablet 1   rosuvastatin (CRESTOR) 20 MG tablet Take 1 tablet (20 mg total) by mouth  daily. 90 tablet 3   Semaglutide,0.25 or 0.5MG/DOS, (OZEMPIC, 0.25 OR 0.5 MG/DOSE,) 2 MG/1.5ML SOPN Inject 0.25 mg into the skin once weekly for 4 weeks, then increase to 0.5 mg once weekly. 6 mL 0   No current facility-administered medications for this visit.    Allergies  Allergen Reactions   Effexor [Venlafaxine] Other (See Comments)    Severe daytime somnolence, head ache and weight gain   Lexapro [Escitalopram Oxalate]     Weight gain    Social History   Socioeconomic History   Marital status: Married    Spouse name: Not on file   Number of children: Not on file   Years of education: Not on file   Highest education level: Not on file  Occupational History   Not on file  Tobacco Use   Smoking status: Never   Smokeless tobacco: Never  Vaping Use   Vaping Use: Never used  Substance and Sexual Activity   Alcohol use: Not Currently   Drug use: Never   Sexual activity: Not on file    Comment: vasectomy  2017  Other Topics Concern   Not on file  Social History Narrative   4 children- 78 (daughter), 38 (daughter), 8 son, 5 son   Works as Dealer   Married   Enjoys baseball, golf, gym   Social Determinants of Radio broadcast assistant Strain: Not on file  Food Insecurity: Not on file  Transportation Needs: Not on file  Physical Activity: Not on  file  Stress: Not on file  Social Connections: Not on file  Intimate Partner Violence: Not on file     Review of Systems: All other systems reviewed and are otherwise negative except as noted above.  Physical Exam: There were no vitals filed for this visit.  GEN- The patient is well appearing, alert and oriented x 3 today.   HEENT: normocephalic, atraumatic; sclera clear, conjunctiva pink; hearing intact; oropharynx clear; neck supple, no JVP Lymph- no cervical lymphadenopathy Lungs- Clear to ausculation bilaterally, normal work of breathing.  No wheezes, rales, rhonchi Heart- Regular rate and rhythm, no murmurs,  rubs or gallops, PMI not laterally displaced GI- soft, non-tender, non-distended, bowel sounds present, no hepatosplenomegaly Extremities- no clubbing, cyanosis, or edema; DP/PT/radial pulses 2+ bilaterally MS- no significant deformity or atrophy Skin- warm and dry, no rash or lesion Psych- euthymic mood, full affect Neuro- strength and sensation are intact  EKG is ordered. Personal review of EKG from today shows ***  Additional studies reviewed include: Previous EP office notes.   Assessment and Plan:  1. PVCs *** Continue metoprolol Needs echo prior to considered AAD  2. Sleep disordered breathing *** sleep study.   Follow up with {Blank single:19197::"Dr. Allred","Dr. Arlan Organ. Klein","Dr. Camnitz","Dr. Lambert","EP APP"} in {Blank single:19197::"2 weeks","4 weeks","3 months","6 months","12 months","as usual post gen change"}   Shirley Friar, [place]  06/14/22 10:27 AM

## 2022-06-18 ENCOUNTER — Ambulatory Visit: Payer: BC Managed Care – PPO | Admitting: Family

## 2022-06-21 ENCOUNTER — Ambulatory Visit: Payer: BC Managed Care – PPO | Admitting: Student

## 2022-07-26 NOTE — Progress Notes (Deleted)
PCP:  Debbrah Alar, NP Primary Cardiologist: None Electrophysiologist: Angel Meredith Leeds, MD   Angel Merritt is a 56 y.o. male seen today for Angel Meredith Leeds, MD for routine electrophysiology followup.  Since last being seen in our clinic the patient reports doing ***.  he denies chest pain, palpitations, dyspnea, PND, orthopnea, nausea, vomiting, dizziness, syncope, edema, weight gain, or early satiety.  Past Medical History:  Diagnosis Date   B12 deficiency 09/07/2020   Diabetes mellitus type 2, controlled ([place]) 09/09/2021   ED (erectile dysfunction)    GERD (gastroesophageal reflux disease)    Hyperlipidemia    OA (osteoarthritis)    OSA (obstructive sleep apnea) 32/35/5732   Umbilical hernia    Varicose veins    Wears glasses    Past Surgical History:  Procedure Laterality Date   COLONOSCOPY WITH PROPOFOL  11-07-2020  dr nandigam   ENDOVENOUS ABLATION SAPHENOUS VEIN W/ LASER Left 11-06-2015   endovenous laser ablation left greater saphenous vein and stab phlebectomies > 20 incisions  left leg  by Curt Jews MD   UMBILICAL HERNIA REPAIR N/A 11/21/2020   Procedure: LAPAROSCOPIC UMBILICAL HERNIA WITH MESH;  Surgeon: Ralene Ok, MD;  Location: [facility];  Service: General;  Laterality: N/A;    Current Outpatient Medications  Medication Sig Dispense Refill   Blood Glucose Monitoring Suppl (CONTOUR NEXT MONITOR) w/Device KIT Check blood sugars daily 1 kit 0   famotidine (PEPCID) 10 MG tablet Take 10 mg by mouth as needed for heartburn or indigestion.     hydrochlorothiazide (MICROZIDE) 12.5 MG capsule Take 1 capsule (12.5 mg total) by mouth daily. 90 capsule 2   ibuprofen (ADVIL,MOTRIN) 200 MG tablet Take 200 mg by mouth as needed for headache, mild pain or moderate pain.     metoprolol tartrate (LOPRESSOR) 25 MG tablet TAKE 1 TABLET BY MOUTH TWICE A DAY 180 tablet 1   rosuvastatin (CRESTOR) 20 MG tablet Take 1 tablet (20 mg total) by mouth  daily. 90 tablet 3   Semaglutide,0.25 or 0.5MG/DOS, (OZEMPIC, 0.25 OR 0.5 MG/DOSE,) 2 MG/1.5ML SOPN Inject 0.25 mg into the skin once weekly for 4 weeks, then increase to 0.5 mg once weekly. 6 mL 0   No current facility-administered medications for this visit.    Allergies  Allergen Reactions   Effexor [Venlafaxine] Other (See Comments)    Severe daytime somnolence, head ache and weight gain   Lexapro [Escitalopram Oxalate]     Weight gain    Social History   Socioeconomic History   Marital status: Married    Spouse name: Not on file   Number of children: Not on file   Years of education: Not on file   Highest education level: Not on file  Occupational History   Not on file  Tobacco Use   Smoking status: Never   Smokeless tobacco: Never  Vaping Use   Vaping Use: Never used  Substance and Sexual Activity   Alcohol use: Not Currently   Drug use: Never   Sexual activity: Not on file    Comment: vasectomy  2017  Other Topics Concern   Not on file  Social History Narrative   4 children- 78 (daughter), 38 (daughter), 8 son, 5 son   Works as Dealer   Married   Enjoys baseball, golf, gym   Social Determinants of Radio broadcast assistant Strain: Not on file  Food Insecurity: Not on file  Transportation Needs: Not on file  Physical Activity: Not on  file  Stress: Not on file  Social Connections: Not on file  Intimate Partner Violence: Not on file     Review of Systems: All other systems reviewed and are otherwise negative except as noted above.  Physical Exam: There were no vitals filed for this visit.  GEN- The patient is well appearing, alert and oriented x 3 today.   HEENT: normocephalic, atraumatic; sclera clear, conjunctiva pink; hearing intact; oropharynx clear; neck supple, no JVP Lymph- no cervical lymphadenopathy Lungs- Clear to ausculation bilaterally, normal work of breathing.  No wheezes, rales, rhonchi Heart- Regular rate and rhythm, no murmurs,  rubs or gallops, PMI not laterally displaced GI- soft, non-tender, non-distended, bowel sounds present, no hepatosplenomegaly Extremities- no clubbing, cyanosis, or edema; DP/PT/radial pulses 2+ bilaterally MS- no significant deformity or atrophy Skin- warm and dry, no rash or lesion Psych- euthymic mood, full affect Neuro- strength and sensation are intact  EKG is ordered. Personal review of EKG from today shows ***  Additional studies reviewed include: Previous EP office notes. ***  Assessment and Plan:  1. PVCs Burden 16.1% on previous monitor *** metoprolol Plan Echo. Was ordered but pt never went through with is  2. OSA ***  Follow up with {Blank single:19197::"Dr. Allred","Dr. Arlan Organ. Klein","Dr. Camnitz","Dr. Lambert","EP APP"} in {Blank single:19197::"2 weeks","4 weeks","3 months","6 months","12 months","as usual post gen change"}   Shirley Friar, [place]  07/26/22 9:17 AM

## 2022-08-02 ENCOUNTER — Ambulatory Visit: Payer: BC Managed Care – PPO | Admitting: Family

## 2022-08-02 ENCOUNTER — Ambulatory Visit: Payer: BC Managed Care – PPO | Admitting: Student

## 2022-08-02 DIAGNOSIS — I493 Ventricular premature depolarization: Secondary | ICD-10-CM

## 2022-08-02 DIAGNOSIS — I10 Essential (primary) hypertension: Secondary | ICD-10-CM

## 2022-08-13 ENCOUNTER — Ambulatory Visit: Payer: BC Managed Care – PPO | Admitting: Family

## 2022-08-16 ENCOUNTER — Ambulatory Visit: Payer: BC Managed Care – PPO | Admitting: Student

## 2022-09-15 DIAGNOSIS — M25562 Pain in left knee: Secondary | ICD-10-CM | POA: Diagnosis not present

## 2022-10-18 NOTE — Progress Notes (Deleted)
PCP:  Debbrah Alar, NP Primary Cardiologist: None Electrophysiologist: Will Meredith Leeds, MD   Angel Merritt is a 56 y.o. male seen today for Will Meredith Leeds, MD for routine electrophysiology followup. Since last being seen in our clinic the patient reports doing ***.  he denies chest pain, palpitations, dyspnea, PND, orthopnea, nausea, vomiting, dizziness, syncope, edema, weight gain, or early satiety.   Past Medical History:  Diagnosis Date   B12 deficiency 09/07/2020   Diabetes mellitus type 2, controlled ([place]) 09/09/2021   ED (erectile dysfunction)    GERD (gastroesophageal reflux disease)    Hyperlipidemia    OA (osteoarthritis)    OSA (obstructive sleep apnea) 14/48/1856   Umbilical hernia    Varicose veins    Wears glasses    Past Surgical History:  Procedure Laterality Date   COLONOSCOPY WITH PROPOFOL  11-07-2020  dr nandigam   ENDOVENOUS ABLATION SAPHENOUS VEIN W/ LASER Left 11-06-2015   endovenous laser ablation left greater saphenous vein and stab phlebectomies > 20 incisions  left leg  by Curt Jews MD   UMBILICAL HERNIA REPAIR N/A 11/21/2020   Procedure: LAPAROSCOPIC UMBILICAL HERNIA WITH MESH;  Surgeon: Ralene Ok, MD;  Location: [facility];  Service: General;  Laterality: N/A;    Current Outpatient Medications  Medication Sig Dispense Refill   Blood Glucose Monitoring Suppl (CONTOUR NEXT MONITOR) w/Device KIT Check blood sugars daily 1 kit 0   famotidine (PEPCID) 10 MG tablet Take 10 mg by mouth as needed for heartburn or indigestion.     hydrochlorothiazide (MICROZIDE) 12.5 MG capsule Take 1 capsule (12.5 mg total) by mouth daily. 90 capsule 2   ibuprofen (ADVIL,MOTRIN) 200 MG tablet Take 200 mg by mouth as needed for headache, mild pain or moderate pain.     metoprolol tartrate (LOPRESSOR) 25 MG tablet TAKE 1 TABLET BY MOUTH TWICE A DAY 180 tablet 1   rosuvastatin (CRESTOR) 20 MG tablet Take 1 tablet (20 mg total) by mouth  daily. 90 tablet 3   Semaglutide,0.25 or 0.5MG/DOS, (OZEMPIC, 0.25 OR 0.5 MG/DOSE,) 2 MG/1.5ML SOPN Inject 0.25 mg into the skin once weekly for 4 weeks, then increase to 0.5 mg once weekly. 6 mL 0   No current facility-administered medications for this visit.    Allergies  Allergen Reactions   Effexor [Venlafaxine] Other (See Comments)    Severe daytime somnolence, head ache and weight gain   Lexapro [Escitalopram Oxalate]     Weight gain    Social History   Socioeconomic History   Marital status: Married    Spouse name: Not on file   Number of children: Not on file   Years of education: Not on file   Highest education level: Not on file  Occupational History   Not on file  Tobacco Use   Smoking status: Never   Smokeless tobacco: Never  Vaping Use   Vaping Use: Never used  Substance and Sexual Activity   Alcohol use: Not Currently   Drug use: Never   Sexual activity: Not on file    Comment: vasectomy  2017  Other Topics Concern   Not on file  Social History Narrative   4 children- 53 (daughter), 77 (daughter), 8 son, 5 son   Works as Dealer   Married   Enjoys baseball, golf, gym   Social Determinants of Radio broadcast assistant Strain: Not on file  Food Insecurity: Not on file  Transportation Needs: Not on file  Physical Activity: Not on  file  Stress: Not on file  Social Connections: Not on file  Intimate Partner Violence: Not on file     Review of Systems: All other systems reviewed and are otherwise negative except as noted above.  Physical Exam: There were no vitals filed for this visit.  GEN- The patient is well appearing, alert and oriented x 3 today.   HEENT: normocephalic, atraumatic; sclera clear, conjunctiva pink; hearing intact; oropharynx clear; neck supple, no JVP Lymph- no cervical lymphadenopathy Lungs- Clear to ausculation bilaterally, normal work of breathing.  No wheezes, rales, rhonchi Heart- {Blank  single:19197::"Regular","Irregularly irregular"} rate and rhythm, no murmurs, rubs or gallops, PMI not laterally displaced GI- soft, non-tender, non-distended, bowel sounds present, no hepatosplenomegaly Extremities- {EDEMA KPVVZ:48270} peripheral edema. no clubbing or cyanosis; DP/PT/radial pulses 2+ bilaterally MS- no significant deformity or atrophy Skin- warm and dry, no rash or lesion Psych- euthymic mood, full affect Neuro- strength and sensation are intact  EKG is ordered. Personal review of EKG from today shows ***  Additional studies reviewed include: Previous EP notes.   LONG TERM MONITOR (8-14 DAYS) HOOK UP AND INTERPRETATION 11/09/2021  Narrative Patch Wear Time:  13 days and 9 hours (2022-11-10T09:13:25-0500 to 2022-11-23T18:21:29-0500)  Patient had a min HR of 47 bpm, max HR of 279 bpm, and avg HR of 87 bpm. Predominant underlying rhythm was Sinus Rhythm. 1067 Ventricular Tachycardia runs occurred, the run with the fastest interval lasting 6 beats with a max rate of 279 bpm, the longest lasting 11 beats with an avg rate of 171 bpm. 60 Supraventricular Tachycardia runs occurred, the run with the fastest interval lasting 5 beats with a max rate of 214 bpm, the longest lasting 59.9 secs with an avg rate of 145 bpm. Isolated SVEs were rare (<1.0%), SVE Couplets were rare (<1.0%), and SVE Triplets were rare (<1.0%). Isolated VEs were frequent (16.1%, [id number]), VE Couplets were occasional (1.1%, 9462), and VE Triplets were rare (<1.0%, 2111). Ventricular Bigeminy and Trigeminy were present.  Summary and conclusions:  Multiple episodes of ventricular tachycardia total of 1067 with total burden of ventricular ectopy of 16.1.  Some episode of ventricular tachycardia did get very short coupling.  Symptoms after PVCs    Assessment and Plan:   1.  PVCs: Burden of 16.1% on monitor as above. Recommend Echo Continue lopressor 25 mg BID   2.  OSA  Encouraged nightly CPAP   Follow up  with {EPMDS:28135} in {EPFOLLOW UP:28173}  Shirley Friar, PA-C  10/18/22 8:28 AM

## 2022-10-25 ENCOUNTER — Ambulatory Visit: Payer: BC Managed Care – PPO | Admitting: Student

## 2022-10-25 DIAGNOSIS — I493 Ventricular premature depolarization: Secondary | ICD-10-CM

## 2022-10-25 DIAGNOSIS — G4733 Obstructive sleep apnea (adult) (pediatric): Secondary | ICD-10-CM

## 2022-10-26 ENCOUNTER — Ambulatory Visit: Payer: BC Managed Care – PPO | Admitting: Family

## 2022-11-24 ENCOUNTER — Ambulatory Visit (INDEPENDENT_AMBULATORY_CARE_PROVIDER_SITE_OTHER): Payer: BC Managed Care – PPO | Admitting: Family

## 2022-11-24 ENCOUNTER — Encounter: Payer: Self-pay | Admitting: Family

## 2022-11-24 VITALS — BP 170/86 | HR 100 | Temp 98.0°F | Resp 18 | Ht 72.0 in | Wt 316.8 lb

## 2022-11-24 DIAGNOSIS — E1169 Type 2 diabetes mellitus with other specified complication: Secondary | ICD-10-CM | POA: Diagnosis not present

## 2022-11-24 DIAGNOSIS — K219 Gastro-esophageal reflux disease without esophagitis: Secondary | ICD-10-CM | POA: Diagnosis not present

## 2022-11-24 DIAGNOSIS — Z0000 Encounter for general adult medical examination without abnormal findings: Secondary | ICD-10-CM | POA: Diagnosis not present

## 2022-11-24 DIAGNOSIS — I10 Essential (primary) hypertension: Secondary | ICD-10-CM

## 2022-11-24 DIAGNOSIS — E785 Hyperlipidemia, unspecified: Secondary | ICD-10-CM | POA: Diagnosis not present

## 2022-11-24 DIAGNOSIS — E669 Obesity, unspecified: Secondary | ICD-10-CM

## 2022-11-24 LAB — LIPID PANEL
Cholesterol: 237 mg/dL — ABNORMAL HIGH (ref 0–200)
HDL: 44.8 mg/dL (ref >39.00)
NonHDL: 191.99
Total CHOL/HDL Ratio: 5
Triglycerides: 206 mg/dL — ABNORMAL HIGH (ref 0.0–149.0)
VLDL: 41.2 mg/dL — ABNORMAL HIGH (ref 0.0–40.0)

## 2022-11-24 LAB — CBC WITH DIFFERENTIAL/PLATELET
Basophils Absolute: 0.1 10*3/uL (ref 0.0–0.1)
Basophils Relative: 0.9 % (ref 0.0–3.0)
Eosinophils Absolute: 0.2 10*3/uL (ref 0.0–0.7)
Eosinophils Relative: 3.3 % (ref 0.0–5.0)
HCT: 42.4 % (ref 39.0–52.0)
Hemoglobin: 14.6 g/dL (ref 13.0–17.0)
Lymphocytes Relative: 35.8 % (ref 12.0–46.0)
Lymphs Abs: 2.6 10*3/uL (ref 0.7–4.0)
MCHC: 34.4 g/dL (ref 30.0–36.0)
MCV: 86.3 fl (ref 78.0–100.0)
Monocytes Absolute: 0.9 10*3/uL (ref 0.1–1.0)
Monocytes Relative: 11.8 % (ref 3.0–12.0)
Neutro Abs: 3.6 10*3/uL (ref 1.4–7.7)
Neutrophils Relative %: 48.2 % (ref 43.0–77.0)
Platelets: 306 10*3/uL (ref 150.0–400.0)
RBC: 4.91 Mil/uL (ref 4.22–5.81)
RDW: 13 % (ref 11.5–15.5)
WBC: 7.4 10*3/uL (ref 4.0–10.5)

## 2022-11-24 LAB — COMPREHENSIVE METABOLIC PANEL
ALT: 38 U/L (ref 0–53)
AST: 19 U/L (ref 0–37)
Albumin: 4.3 g/dL (ref 3.5–5.2)
Alkaline Phosphatase: 130 U/L — ABNORMAL HIGH (ref 39–117)
BUN: 17 mg/dL (ref 6–23)
CO2: 28 mEq/L (ref 19–32)
Calcium: 9.3 mg/dL (ref 8.4–10.5)
Chloride: 97 mEq/L (ref 96–112)
Creatinine, Ser: 1.01 mg/dL (ref 0.40–1.50)
GFR: 84.47 mL/min (ref >60.00)
Glucose, Bld: 240 mg/dL — ABNORMAL HIGH (ref 70–99)
Potassium: 4.3 mEq/L (ref 3.5–5.1)
Sodium: 136 mEq/L (ref 135–145)
Total Bilirubin: 0.7 mg/dL (ref 0.2–1.2)
Total Protein: 6.7 g/dL (ref 6.0–8.3)

## 2022-11-24 LAB — HEMOGLOBIN A1C: Hgb A1c MFr Bld: 10.2 % — ABNORMAL HIGH (ref 4.6–6.5)

## 2022-11-24 LAB — MICROALBUMIN / CREATININE URINE RATIO
Creatinine,U: 177.9 mg/dL
Microalb Creat Ratio: 1.8 mg/g (ref 0.0–30.0)
Microalb, Ur: 3.2 mg/dL — ABNORMAL HIGH (ref 0.0–1.9)

## 2022-11-24 LAB — TSH: TSH: 1.53 u[IU]/mL (ref 0.35–5.50)

## 2022-11-24 LAB — LDL CHOLESTEROL, DIRECT: Direct LDL: 167 mg/dL

## 2022-11-24 NOTE — Assessment & Plan Note (Signed)
Declines statin 

## 2022-11-24 NOTE — Assessment & Plan Note (Signed)
Discussed diet/exercise weight loss.  Recommended flu shot/covid booster. Pt declines. Tetanus and Shingrix are up to date.  Colonoscopy up to date.

## 2022-11-24 NOTE — Assessment & Plan Note (Signed)
Uncontrolled. Pt discontinued antihypertensive medications due "not liking the way it made me feel." Offered to try a different antihypertensive medication to see if he could better tolerate. Pt declined any bp rx.

## 2022-11-24 NOTE — Progress Notes (Signed)
Subjective:     Patient ID: Angel Merritt, male    DOB: July 04, 1966, 56 y.o.   MRN: [id number]  Chief Complaint  Patient presents with   Annual Exam    HPI Patient presents today for complete physical.  Immunizations: declines flu shot declines covid booster Diet:  makes all his meals himself Exercise: no formal exercise Wt Readings from Last 3 Encounters:  11/24/22 (!) 316 lb 12.8 oz (143.7 kg)  01/26/22 (!) 320 lb (145.2 kg)  12/21/21 (!) 326 lb (147.9 kg)    Colonoscopy: 2021 due 2026 Vision: scheduled for next month Dental: up to date  Lab Results  Component Value Date   HGBA1C 8.3 (H) 01/26/2022   HGBA1C 7.5 (H) 09/09/2021   Lab Results  Component Value Date   MICROALBUR 0.8 10/23/2021   [place] 95 11/24/2021   CREATININE 1.08 01/26/2022    Health Maintenance Due  Topic Date Due   INFLUENZA VACCINE  Never done   HEMOGLOBIN A1C  07/26/2022   Diabetic kidney evaluation - Urine ACR  10/23/2022   FOOT EXAM  10/23/2022    Past Medical History:  Diagnosis Date   B12 deficiency 09/07/2020   Diabetes mellitus type 2, controlled ([place]) 09/09/2021   ED (erectile dysfunction)    GERD (gastroesophageal reflux disease)    Hyperlipidemia    OA (osteoarthritis)    OSA (obstructive sleep apnea) 17/61/6073   Umbilical hernia    Varicose veins    Wears glasses     Past Surgical History:  Procedure Laterality Date   COLONOSCOPY WITH PROPOFOL  11-07-2020  dr nandigam   ENDOVENOUS ABLATION SAPHENOUS VEIN W/ LASER Left 11-06-2015   endovenous laser ablation left greater saphenous vein and stab phlebectomies > 20 incisions  left leg  by Curt Jews MD   UMBILICAL HERNIA REPAIR N/A 11/21/2020   Procedure: LAPAROSCOPIC UMBILICAL HERNIA WITH MESH;  Surgeon: Ralene Ok, MD;  Location: [facility];  Service: General;  Laterality: N/A;    Family History  Problem Relation Age of Onset   Coronary artery disease Mother 59       (has 3 stents)   Heart  disease Mother    Colon cancer Father 52       died at 9 (following a hernia repair)   Cancer Father    Coronary artery disease Maternal Grandmother 69       smoker   Cancer Paternal Grandfather        brain tumor   Esophageal cancer Neg Hx    Stomach cancer Neg Hx    Rectal cancer Neg Hx     Social History   Socioeconomic History   Marital status: Married    Spouse name: Not on file   Number of children: Not on file   Years of education: Not on file   Highest education level: Not on file  Occupational History   Not on file  Tobacco Use   Smoking status: Never   Smokeless tobacco: Never  Vaping Use   Vaping Use: Never used  Substance and Sexual Activity   Alcohol use: Not Currently   Drug use: Never   Sexual activity: Not on file    Comment: vasectomy  2017  Other Topics Concern   Not on file  Social History Narrative   4 children- 52 (daughter), 54 (daughter), 8 son, 5 son   Works as Dealer   Married   Enjoys baseball, golf, gym   Social Determinants of Health  Financial Resource Strain: Not on file  Food Insecurity: Not on file  Transportation Needs: Not on file  Physical Activity: Not on file  Stress: Not on file  Social Connections: Not on file  Intimate Partner Violence: Not on file    Outpatient Medications Prior to Visit  Medication Sig Dispense Refill   Blood Glucose Monitoring Suppl (CONTOUR NEXT MONITOR) w/Device KIT Check blood sugars daily 1 kit 0   famotidine (PEPCID) 10 MG tablet Take 10 mg by mouth as needed for heartburn or indigestion.     hydrochlorothiazide (MICROZIDE) 12.5 MG capsule Take 1 capsule (12.5 mg total) by mouth daily. 90 capsule 2   ibuprofen (ADVIL,MOTRIN) 200 MG tablet Take 200 mg by mouth as needed for headache, mild pain or moderate pain.     metoprolol tartrate (LOPRESSOR) 25 MG tablet TAKE 1 TABLET BY MOUTH TWICE A DAY 180 tablet 1   Semaglutide,0.25 or 0.5MG/DOS, (OZEMPIC, 0.25 OR 0.5 MG/DOSE,) 2 MG/1.5ML SOPN Inject  0.25 mg into the skin once weekly for 4 weeks, then increase to 0.5 mg once weekly. 6 mL 0   rosuvastatin (CRESTOR) 20 MG tablet Take 1 tablet (20 mg total) by mouth daily. 90 tablet 3   No facility-administered medications prior to visit.    Allergies  Allergen Reactions   Effexor [Venlafaxine] Other (See Comments)    Severe daytime somnolence, head ache and weight gain   Lexapro [Escitalopram Oxalate]     Weight gain    Review of Systems  Constitutional:  Negative for weight loss.  HENT:  Negative for hearing loss.   Eyes:  Negative for blurred vision.  Respiratory:  Negative for cough and shortness of breath.   Cardiovascular:  Negative for chest pain.  Genitourinary:  Negative for dysuria, frequency and urgency.  Musculoskeletal:  Positive for myalgias (just from use at work). Negative for joint pain.  Skin:  Negative for rash.  Neurological:  Negative for headaches.  Psychiatric/Behavioral:         Denies depression/anxiety       Objective:    Physical Exam  BP (!) 170/86   Pulse 100   Temp 98 F (36.7 C)   Resp 18   Ht 6' (1.829 m)   Wt (!) 316 lb 12.8 oz (143.7 kg)   SpO2 97%   BMI 42.97 kg/m  Wt Readings from Last 3 Encounters:  11/24/22 (!) 316 lb 12.8 oz (143.7 kg)  01/26/22 (!) 320 lb (145.2 kg)  12/21/21 (!) 326 lb (147.9 kg)   Physical Exam  Constitutional: He is oriented to person, place, and time. He appears well-developed and well-nourished. No distress.  HENT:  Head: Normocephalic and atraumatic.  Right Ear: Tympanic membrane and ear canal normal.  Left Ear: Tympanic membrane and ear canal normal.  Eyes: Pupils are equal, round, and reactive to light. No scleral icterus.  Neck: Normal range of motion. No thyromegaly present.  Cardiovascular: Normal rate and regular rhythm.   No murmur heard. Pulmonary/Chest: Effort normal and breath sounds normal. No respiratory distress. He has no wheezes. He has no rales. He exhibits no tenderness.   Abdominal: Soft. Bowel sounds are normal. He exhibits no distension and no mass. There is no tenderness. There is no rebound and no guarding.  Musculoskeletal: He exhibits no edema.  Lymphadenopathy:    He has no cervical adenopathy.  Neurological: He is alert and oriented to person, place, and time. He exhibits normal muscle tone. Coordination normal.  Skin: Skin is warm  and dry.  Psychiatric: He has a normal mood and affect. His behavior is normal. Judgment and thought content normal.           Assessment & Plan:       Assessment & Plan:   Problem List Items Addressed This Visit       Unprioritized   Preventative health care - Primary    Discussed diet/exercise weight loss.  Recommended flu shot/covid booster. Pt declines. Tetanus and Shingrix are up to date.  Colonoscopy up to date.       Relevant Orders   CBC w/Diff   TSH   GERD    Stable on Pepcid- continue same.       Essential hypertension    Uncontrolled. Pt discontinued antihypertensive medications due "not liking the way it made me feel." Offered to try a different antihypertensive medication to see if he could better tolerate. Pt declined any bp rx.       Dyslipidemia    Declines statin.       Relevant Orders   Comp Met (CMET)   Lipid panel   Diabetes mellitus type 2 in obese [facility])    Declines medical therapy for his diabetes.       Relevant Orders   Urine Microalbumin w/creat. ratio   HgB A1c    I am having [name] "Marlou Sa" maintain his ibuprofen, famotidine, rosuvastatin, Ozempic (0.25 or 0.5 MG/DOSE), hydrochlorothiazide, Contour Next Monitor, and metoprolol tartrate.  No orders of the defined types were placed in this encounter.

## 2022-11-24 NOTE — Assessment & Plan Note (Signed)
Stable on Pepcid- continue same.

## 2022-11-24 NOTE — Assessment & Plan Note (Signed)
Declines medical therapy for his diabetes.

## 2022-11-26 ENCOUNTER — Encounter: Payer: Self-pay | Admitting: Family

## 2023-01-31 DIAGNOSIS — J040 Acute laryngitis: Secondary | ICD-10-CM | POA: Diagnosis not present

## 2023-01-31 DIAGNOSIS — J069 Acute upper respiratory infection, unspecified: Secondary | ICD-10-CM | POA: Diagnosis not present

## 2023-03-01 DIAGNOSIS — E119 Type 2 diabetes mellitus without complications: Secondary | ICD-10-CM | POA: Diagnosis not present

## 2023-03-01 LAB — HM DIABETES EYE EXAM

## 2023-09-20 DIAGNOSIS — R81 Glycosuria: Secondary | ICD-10-CM | POA: Diagnosis not present

## 2023-09-20 DIAGNOSIS — R739 Hyperglycemia, unspecified: Secondary | ICD-10-CM | POA: Diagnosis not present

## 2023-09-20 DIAGNOSIS — G4733 Obstructive sleep apnea (adult) (pediatric): Secondary | ICD-10-CM | POA: Diagnosis not present

## 2023-10-07 DIAGNOSIS — E119 Type 2 diabetes mellitus without complications: Secondary | ICD-10-CM | POA: Diagnosis not present

## 2023-11-06 DIAGNOSIS — E119 Type 2 diabetes mellitus without complications: Secondary | ICD-10-CM | POA: Diagnosis not present

## 2023-11-28 ENCOUNTER — Encounter: Payer: BC Managed Care – PPO | Admitting: Family

## 2023-12-07 DIAGNOSIS — E119 Type 2 diabetes mellitus without complications: Secondary | ICD-10-CM | POA: Diagnosis not present

## 2023-12-13 ENCOUNTER — Telehealth: Payer: Self-pay | Admitting: Family

## 2023-12-13 ENCOUNTER — Ambulatory Visit (INDEPENDENT_AMBULATORY_CARE_PROVIDER_SITE_OTHER): Payer: BC Managed Care – PPO | Admitting: Family

## 2023-12-13 ENCOUNTER — Other Ambulatory Visit (HOSPITAL_BASED_OUTPATIENT_CLINIC_OR_DEPARTMENT_OTHER): Payer: Self-pay

## 2023-12-13 VITALS — BP 128/76 | HR 85 | Temp 98.9°F | Resp 16 | Ht 71.0 in | Wt 306.0 lb

## 2023-12-13 DIAGNOSIS — I10 Essential (primary) hypertension: Secondary | ICD-10-CM

## 2023-12-13 DIAGNOSIS — E1169 Type 2 diabetes mellitus with other specified complication: Secondary | ICD-10-CM

## 2023-12-13 DIAGNOSIS — E669 Obesity, unspecified: Secondary | ICD-10-CM

## 2023-12-13 DIAGNOSIS — I493 Ventricular premature depolarization: Secondary | ICD-10-CM

## 2023-12-13 DIAGNOSIS — Z7984 Long term (current) use of oral hypoglycemic drugs: Secondary | ICD-10-CM

## 2023-12-13 MED ORDER — METFORMIN HCL 500 MG PO TABS: 500.0000 mg | ORAL_TABLET | Freq: Two times a day (BID) | ORAL | 1 refills | Status: DC

## 2023-12-13 MED ORDER — TIRZEPATIDE 2.5 MG/0.5ML ~~LOC~~ SOAJ
2.5000 mg | SUBCUTANEOUS | 0 refills | Status: DC
  Filled 2023-12-13: qty 2, 28d supply, fill #0

## 2023-12-13 NOTE — Assessment & Plan Note (Signed)
 It doesn't look like he followed through with testing recommendations or OSA treatment from cardiology- will ask pt to schedule a follow up visit with cardiology.

## 2023-12-13 NOTE — Assessment & Plan Note (Addendum)
 Lab Results  Component Value Date   HGBA1C 10.2 (H) 11/24/2022   HGBA1C 8.3 (H) 01/26/2022   HGBA1C 7.5 (H) 09/09/2021   Lab Results  Component Value Date   MICROALBUR 3.2 (H) 11/24/2022   LDLCALC 95 11/24/2021   CREATININE 1.01 11/24/2022   Uncontrolled blood glucose levels with a history of inconsistent metformin  use. Patient has been self-monitoring blood glucose levels, reporting postprandial readings around 206. Discussed the importance of consistent metformin  use and the potential benefits of adding a GLP-1 receptor agonist (Mounjaro ) for better glycemic control and weight loss. -Continue Metformin , refill prescription. -Initiate Mounjaro  injection once weekly, pending insurance approval. -Check HbA1c today to assess average blood glucose control over the past 3 months. -Follow up in 1 month to assess response to Mounjaro .

## 2023-12-13 NOTE — Assessment & Plan Note (Signed)
BP looks ok today.  Monitor.

## 2023-12-13 NOTE — Patient Instructions (Signed)
 VISIT SUMMARY:  Today, we reviewed your diabetes management and overall health. You have been diligent in monitoring your blood glucose levels and have made positive lifestyle changes, including daily exercise and weight loss. We discussed your current medications and potential new treatments to better control your blood sugar levels.  YOUR PLAN:  -TYPE 2 DIABETES MELLITUS: Type 2 Diabetes Mellitus is a condition where your body does not use insulin properly, leading to high blood sugar levels. We discussed the importance of taking your metformin  consistently and introduced a new medication, Mounjaro , which you will take as an injection once a week to help control your blood sugar and aid in weight loss. We will also check your HbA1c today to see your average blood sugar levels over the past three months. Please follow up in one month to see how you are responding to the new medication.  -FOOT CARE IN DIABETES: Foot care is crucial for people with diabetes to prevent injuries and infections. Continue to take care of your feet and always wear footwear to protect them.  -GENERAL HEALTH MAINTENANCE: Your next colonoscopy is due in 2026. You have declined the flu shot for this year.  INSTRUCTIONS:  Please follow up in one month to assess your response to the new medication, Mounjaro . Additionally, we will check your HbA1c today to evaluate your average blood sugar control over the past three months.

## 2023-12-13 NOTE — Assessment & Plan Note (Signed)
 Hopefully initiation of GLP-1 will help with weight loss.

## 2023-12-13 NOTE — Telephone Encounter (Signed)
 See mychart.

## 2023-12-13 NOTE — Progress Notes (Signed)
 Subjective:     Patient ID: Angel Merritt, male    DOB: 1966-06-13, 58 y.o.   MRN: [id number]  Chief Complaint  Patient presents with   Annual Exam    HPI  Discussed the use of AI scribe software for clinical note transcription with the patient, who gave verbal consent to proceed.  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 He has not been seen here in 13 months. He reports monitoring his blood glucose levels at home multiple times a day, with a recent reading of 206 post-breakfast. He acknowledges that this is higher than the ideal range. 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 He has not seen a diabetes specialist.  Grayer states he has been proactive in managing his health. He has been working out daily for the past three months, resulting in a weight loss of 10 pounds over the past year. He has also stopped taking ibuprofen and has been taking multivitamins. He reports no issues with these changes.      Wt Readings from Last 3 Encounters:  12/13/23 (!) 306 lb (138.8 kg)  11/24/22 (!) 316 lb 12.8 oz (143.7 kg)  01/26/22 (!) 320 lb (145.2 kg)   Lab Results  Component Value Date   CHOL 237 (H) 11/24/2022   HDL 44.80 11/24/2022   LDLCALC 95 11/24/2021   LDLDIRECT 167.0 11/24/2022   TRIG 206.0 (H) 11/24/2022   CHOLHDL 5 11/24/2022       Health Maintenance Due  Topic Date Due   FOOT EXAM  10/23/2022   HEMOGLOBIN A1C  05/26/2023   Diabetic kidney evaluation - eGFR measurement  11/25/2023   Diabetic kidney evaluation - Urine ACR  11/25/2023    Past Medical History:  Diagnosis Date   B12 deficiency 09/07/2020   Diabetes mellitus type 2, controlled (HCC) 09/09/2021   ED (erectile dysfunction)    GERD (gastroesophageal reflux disease)     Hyperlipidemia    OA (osteoarthritis)    OSA (obstructive sleep apnea) 10/15/2021   Umbilical hernia    Varicose veins    Wears glasses     Past Surgical History:  Procedure Laterality Date   COLONOSCOPY WITH PROPOFOL   11-07-2020  dr nandigam   ENDOVENOUS ABLATION SAPHENOUS VEIN W/ LASER Left 11-06-2015   endovenous laser ablation left greater saphenous vein and stab phlebectomies > 20 incisions  left leg  by Krystal Doing MD   UMBILICAL HERNIA REPAIR N/A 11/21/2020   Procedure: LAPAROSCOPIC UMBILICAL HERNIA WITH MESH;  Surgeon: Rubin Calamity, MD;  Location: [facility];  Service: General;  Laterality: N/A;    Family History  Problem Relation Age of Onset   Coronary artery disease Mother 40       (has 3 stents)   Heart disease Mother    Colon cancer Father 51       died at 36 (following a hernia repair)   Cancer Father    Coronary artery disease Maternal Grandmother 16       smoker   Cancer Paternal Grandfather        brain tumor   Esophageal cancer Neg Hx    Stomach cancer Neg Hx    Rectal cancer Neg Hx     Social History   Socioeconomic History   Marital status: Married  Spouse name: Not on file   Number of children: Not on file   Years of education: Not on file   Highest education level: Not on file  Occupational History   Not on file  Tobacco Use   Smoking status: Never   Smokeless tobacco: Never  Vaping Use   Vaping status: Never Used  Substance and Sexual Activity   Alcohol use: Not Currently   Drug use: Never   Sexual activity: Not on file    Comment: vasectomy  2017  Other Topics Concern   Not on file  Social History Narrative   4 children- 4 (daughter), 72 (daughter), 8 son, 5 son   Works as curator   Married   Enjoys baseball, golf, gym   Social Drivers of Corporate Investment Banker Strain: Not on file  Food Insecurity: Not on file  Transportation Needs: Not on file  Physical Activity: Not on file  Stress: Not on file   Social Connections: Unknown (04/09/2022)   Received from [employer] [employer], [facility]   Social Network    Social Network: Not on file  Intimate Partner Violence: Unknown (03/08/2022)   Received from [facility], [facility]   HITS    Physically Hurt: Not on file    Insult or Talk Down To: Not on file    Threaten Physical Harm: Not on file    Scream or Curse: Not on file    Outpatient Medications Prior to Visit  Medication Sig Dispense Refill   acetaminophen  (TYLENOL ) 500 MG tablet Take 500 mg by mouth every 6 (six) hours as needed.     Blood Glucose Monitoring Suppl (CONTOUR NEXT MONITOR) w/Device KIT Check blood sugars daily 1 kit 0   famotidine (PEPCID) 10 MG tablet Take 10 mg by mouth as needed for heartburn or indigestion.     metFORMIN  (GLUCOPHAGE ) 500 MG tablet Take by mouth.     ibuprofen (ADVIL,MOTRIN) 200 MG tablet Take 200 mg by mouth as needed for headache, mild pain or moderate pain.     No facility-administered medications prior to visit.    Allergies  Allergen Reactions   Effexor  [Venlafaxine ] Other (See Comments)    Severe daytime somnolence, head ache and weight gain   Lexapro  [Escitalopram  Oxalate]     Weight gain    ROS See HPI    Objective:    Physical Exam Constitutional:      General: He is not in acute distress.    Appearance: He is well-developed.  HENT:     Head: Normocephalic and atraumatic.  Cardiovascular:     Rate and Rhythm: Normal rate and regular rhythm.     Heart sounds: No murmur heard. Pulmonary:     Effort: Pulmonary effort is normal. No respiratory distress.     Breath sounds: Normal breath sounds. No wheezing or rales.  Skin:    General: Skin is warm and dry.  Neurological:     Mental Status: He is alert and oriented to person, place, and time.  Psychiatric:        Behavior: Behavior normal.        Thought Content: Thought content normal.    Diabetic Foot Exam - Simple   Simple Foot Form Visual Inspection No  deformities, no ulcerations, no other skin breakdown bilaterally: Yes Sensation Testing Intact to touch and monofilament testing bilaterally: Yes Pulse Check Posterior Tibialis and Dorsalis pulse intact bilaterally: Yes Comments       BP 128/76 (BP Location: Left  Arm, Patient Position: Sitting, Cuff Size: Large)   Pulse 85   Temp 98.9 F (37.2 C) (Oral)   Resp 16   Ht 5' 11 (1.803 m)   Wt (!) 306 lb (138.8 kg)   SpO2 98%   BMI 42.68 kg/m  Wt Readings from Last 3 Encounters:  12/13/23 (!) 306 lb (138.8 kg)  11/24/22 (!) 316 lb 12.8 oz (143.7 kg)  01/26/22 (!) 320 lb (145.2 kg)       Assessment & Plan:   Problem List Items Addressed This Visit       Unprioritized   Ventricular ectopy   It doesn't look like he followed through with testing recommendations or OSA treatment from cardiology- will ask pt to schedule a follow up visit with cardiology.       Type 2 diabetes mellitus with obesity (HCC) - Primary   Lab Results  Component Value Date   HGBA1C 10.2 (H) 11/24/2022   HGBA1C 8.3 (H) 01/26/2022   HGBA1C 7.5 (H) 09/09/2021   Lab Results  Component Value Date   MICROALBUR 3.2 (H) 11/24/2022   LDLCALC 95 11/24/2021   CREATININE 1.01 11/24/2022   Uncontrolled blood glucose levels with a history of inconsistent metformin  use. Patient has been self-monitoring blood glucose levels, reporting postprandial readings around 206. Discussed the importance of consistent metformin  use and the potential benefits of adding a GLP-1 receptor agonist (Mounjaro ) for better glycemic control and weight loss. -Continue Metformin , refill prescription. -Initiate Mounjaro  injection once weekly, pending insurance approval. -Check HbA1c today to assess average blood glucose control over the past 3 months. -Follow up in 1 month to assess response to Mounjaro .       Relevant Medications   tirzepatide  (MOUNJARO ) 2.5 MG/0.5ML Pen   metFORMIN  (GLUCOPHAGE ) 500 MG tablet   Other Relevant  Orders   HgB A1c   Lipid panel   Comp Met (CMET)   Urine Microalbumin w/creat. ratio   Morbid obesity (HCC)   Hopefully initiation of GLP-1 will help with weight loss.       Relevant Medications   tirzepatide  (MOUNJARO ) 2.5 MG/0.5ML Pen   metFORMIN  (GLUCOPHAGE ) 500 MG tablet   Essential hypertension   BP looks ok today. Monitor.        I have discontinued Angel Merritt Dean's ibuprofen. I have also changed his metFORMIN . Additionally, I am having him start on tirzepatide . Lastly, I am having him maintain his famotidine, Contour Next Monitor, and acetaminophen .  Meds ordered this encounter  Medications   tirzepatide  (MOUNJARO ) 2.5 MG/0.5ML Pen    Sig: Inject 2.5 mg into the skin once a week.    Dispense:  2 mL    Refill:  0    Supervising Provider:   DOMENICA BLACKBIRD A [[id number]]   metFORMIN  (GLUCOPHAGE ) 500 MG tablet    Sig: Take 1 tablet (500 mg total) by mouth 2 (two) times daily with a meal.    Dispense:  180 tablet    Refill:  1    Supervising Provider:   DOMENICA BLACKBIRD A [[id number]]

## 2023-12-14 ENCOUNTER — Telehealth: Payer: Self-pay | Admitting: Family

## 2023-12-14 LAB — COMPREHENSIVE METABOLIC PANEL
ALT: 20 U/L (ref 0–53)
AST: 16 U/L (ref 0–37)
Albumin: 4.5 g/dL (ref 3.5–5.2)
Alkaline Phosphatase: 97 U/L (ref 39–117)
BUN: 19 mg/dL (ref 6–23)
CO2: 29 meq/L (ref 19–32)
Calcium: 9.7 mg/dL (ref 8.4–10.5)
Chloride: 99 meq/L (ref 96–112)
Creatinine, Ser: 1.11 mg/dL (ref 0.40–1.50)
GFR: 74.86 mL/min (ref >60.00)
Glucose, Bld: 179 mg/dL — ABNORMAL HIGH (ref 70–99)
Potassium: 4.1 meq/L (ref 3.5–5.1)
Sodium: 137 meq/L (ref 135–145)
Total Bilirubin: 0.3 mg/dL (ref 0.2–1.2)
Total Protein: 6.9 g/dL (ref 6.0–8.3)

## 2023-12-14 LAB — LIPID PANEL
Cholesterol: 247 mg/dL — ABNORMAL HIGH (ref 0–200)
HDL: 45.6 mg/dL (ref >39.00)
LDL Cholesterol: 132 mg/dL — ABNORMAL HIGH (ref 0–99)
NonHDL: 201.01
Total CHOL/HDL Ratio: 5
Triglycerides: 347 mg/dL — ABNORMAL HIGH (ref 0.0–149.0)
VLDL: 69.4 mg/dL — ABNORMAL HIGH (ref 0.0–40.0)

## 2023-12-14 LAB — HEMOGLOBIN A1C: Hgb A1c MFr Bld: 8.4 % — ABNORMAL HIGH (ref 4.6–6.5)

## 2023-12-14 LAB — MICROALBUMIN / CREATININE URINE RATIO
Creatinine,U: 31.1 mg/dL
Microalb Creat Ratio: 2.2 mg/g (ref 0.0–30.0)
Microalb, Ur: <0.7 mg/dL (ref 0.0–1.9)

## 2023-12-14 MED ORDER — ATORVASTATIN CALCIUM 20 MG PO TABS: 20.0000 mg | ORAL_TABLET | Freq: Every day | ORAL | 1 refills | Status: DC

## 2023-12-14 NOTE — Telephone Encounter (Signed)
 A1C is better than it was but still too high at 8.5.  I would like him to continue metformin , add mounjaro  as we discussed at his visit and continue his work on diet/exercise and weight loss.  Also, cholesterol is high. Since he is diabetic I would like him to add atorvastatin  20mg  once daily to decrease his risk of heart attack and stroke.

## 2023-12-15 NOTE — Telephone Encounter (Signed)
 Patient notified of results, provider's comments, new prescription and recommendations. He verbalized undertanding.

## 2024-01-05 MED ORDER — TIRZEPATIDE 5 MG/0.5ML ~~LOC~~ SOAJ: 5.0000 mg | SUBCUTANEOUS | 1 refills | Status: DC

## 2024-01-05 NOTE — Addendum Note (Signed)
Addended by: Sandford Craze on: 01/05/2024 10:44 AM   Modules accepted: Orders

## 2024-01-07 DIAGNOSIS — E119 Type 2 diabetes mellitus without complications: Secondary | ICD-10-CM | POA: Diagnosis not present

## 2024-01-10 ENCOUNTER — Other Ambulatory Visit (HOSPITAL_BASED_OUTPATIENT_CLINIC_OR_DEPARTMENT_OTHER): Payer: Self-pay

## 2024-01-10 MED ORDER — TIRZEPATIDE 5 MG/0.5ML ~~LOC~~ SOAJ
5.0000 mg | SUBCUTANEOUS | 1 refills | Status: DC
  Filled 2024-01-10 – 2024-01-11 (×2): qty 2, 28d supply, fill #0

## 2024-01-10 NOTE — Addendum Note (Signed)
Addended by: Sandford Craze on: 01/10/2024 12:09 PM   Modules accepted: Orders

## 2024-01-11 ENCOUNTER — Other Ambulatory Visit (HOSPITAL_BASED_OUTPATIENT_CLINIC_OR_DEPARTMENT_OTHER): Payer: Self-pay

## 2024-01-13 ENCOUNTER — Encounter: Payer: Self-pay | Admitting: Family

## 2024-01-13 ENCOUNTER — Ambulatory Visit (INDEPENDENT_AMBULATORY_CARE_PROVIDER_SITE_OTHER): Payer: BC Managed Care – PPO | Admitting: Family

## 2024-01-13 VITALS — BP 113/77 | HR 99 | Resp 16 | Ht 71.0 in | Wt 300.0 lb

## 2024-01-13 DIAGNOSIS — Z0001 Encounter for general adult medical examination with abnormal findings: Secondary | ICD-10-CM

## 2024-01-13 DIAGNOSIS — E785 Hyperlipidemia, unspecified: Secondary | ICD-10-CM | POA: Diagnosis not present

## 2024-01-13 DIAGNOSIS — R5383 Other fatigue: Secondary | ICD-10-CM

## 2024-01-13 DIAGNOSIS — Z125 Encounter for screening for malignant neoplasm of prostate: Secondary | ICD-10-CM

## 2024-01-13 DIAGNOSIS — Z7985 Long-term (current) use of injectable non-insulin antidiabetic drugs: Secondary | ICD-10-CM

## 2024-01-13 DIAGNOSIS — E1169 Type 2 diabetes mellitus with other specified complication: Secondary | ICD-10-CM | POA: Diagnosis not present

## 2024-01-13 DIAGNOSIS — E669 Obesity, unspecified: Secondary | ICD-10-CM

## 2024-01-13 DIAGNOSIS — Z0000 Encounter for general adult medical examination without abnormal findings: Secondary | ICD-10-CM

## 2024-01-13 NOTE — Progress Notes (Signed)
 =  Subjective:     Patient ID: Angel Merritt, male    DOB: 12/08/1965, 58 y.o.   MRN: [id number]  Chief Complaint  Patient presents with   Annual Exam    HPI  Discussed the use of AI scribe software for clinical note transcription with the patient, who gave verbal consent to proceed.  History of Present Illness   Angel Merritt is a 58 year old male who presents for an annual physical exam.  No cough, cold symptoms, skin concerns, hearing or vision issues, leg swelling, urinary problems, or unusual muscle or joint pain. He experiences frequent headaches and fatigue, suspecting low testosterone .  He is actively improving his diet and exercise routine, eliminating refined carbohydrates and typically having a salad for lunch. He exercises for 30 minutes to an hour every morning using a treadmill, Bowflex, and weights.  He takes Pepcid once daily in the morning as needed and started atorvastatin  (Lipitor) last month for cholesterol, taken at night.  He is up to date with annual eye exams and dental visits every four months. No changes in family medical history.  No current alcohol, drug, tobacco, or vaping use.     Immunizations: declines covid and flu shot Diet: Reports that he is trying to eat healthier and cut out carbs Wt Readings from Last 3 Encounters:  01/13/24 300 lb (136.1 kg)  12/13/23 (!) 306 lb (138.8 kg)  11/24/22 (!) 316 lb 12.8 oz (143.7 kg)    Exercise: 30 minutes on the treadmill each morning.  Colonoscopy: due 12/26 Vision: up to date Dental: up todate   Health Maintenance Due  Topic Date Due   Pneumococcal Vaccine 62-37 Years old (1 of 2 - PCV) Never done   FOOT EXAM  10/23/2022    Past Medical History:  Diagnosis Date   B12 deficiency 09/07/2020   Diabetes mellitus type 2, controlled (HCC) 09/09/2021   ED (erectile dysfunction)    GERD (gastroesophageal reflux disease)    Hyperlipidemia    OA (osteoarthritis)    OSA (obstructive sleep  apnea) 10/15/2021   Umbilical hernia    Varicose veins    Wears glasses     Past Surgical History:  Procedure Laterality Date   COLONOSCOPY WITH PROPOFOL   11-07-2020  dr nandigam   ENDOVENOUS ABLATION SAPHENOUS VEIN W/ LASER Left 11-06-2015   endovenous laser ablation left greater saphenous vein and stab phlebectomies > 20 incisions  left leg  by Krystal Doing MD   UMBILICAL HERNIA REPAIR N/A 11/21/2020   Procedure: LAPAROSCOPIC UMBILICAL HERNIA WITH MESH;  Surgeon: Rubin Calamity, MD;  Location: [facility];  Service: General;  Laterality: N/A;    Family History  Problem Relation Age of Onset   Coronary artery disease Mother 32       (has 3 stents)   Heart disease Mother    Colon cancer Father 28       died at 76 (following a hernia repair)   Cancer Father    Coronary artery disease Maternal Grandmother 91       smoker   Cancer Paternal Grandfather        brain tumor   Esophageal cancer Neg Hx    Stomach cancer Neg Hx    Rectal cancer Neg Hx     Social History   Socioeconomic History   Marital status: Married    Spouse name: Not on file   Number of children: Not on file   Years of education: Not on  file   Highest education level: Associate degree: occupational, scientist, product/process development, or vocational program  Occupational History   Not on file  Tobacco Use   Smoking status: Never   Smokeless tobacco: Never  Vaping Use   Vaping status: Never Used  Substance and Sexual Activity   Alcohol use: Not Currently   Drug use: Never   Sexual activity: Not on file    Comment: vasectomy  2017  Other Topics Concern   Not on file  Social History Narrative   4 children- 8 (daughter), 25 (daughter), 8 son, 5 son   Works as curator   Married   Enjoys baseball, golf, gym   Social Drivers of Corporate Investment Banker Strain: High Risk (01/11/2024)   Overall Financial Resource Strain (CARDIA)    Difficulty of Paying Living Expenses: Hard  Food Insecurity: Food Insecurity  Present (01/11/2024)   Hunger Vital Sign    Worried About Running Out of Food in the Last Year: Sometimes true    Ran Out of Food in the Last Year: Never true  Transportation Needs: No Transportation Needs (01/11/2024)   PRAPARE - Administrator, Civil Service (Medical): No    Lack of Transportation (Non-Medical): No  Physical Activity: Insufficiently Active (01/11/2024)   Exercise Vital Sign    Days of Exercise per Week: 6 days    Minutes of Exercise per Session: 20 min  Stress: No Stress Concern Present (01/11/2024)   [employer] of Occupational Health - Occupational Stress Questionnaire    Feeling of Stress : Not at all  Social Connections: Moderately Integrated (01/11/2024)   Social Connection and Isolation Panel [NHANES]    Frequency of Communication with Friends and Family: Twice a week    Frequency of Social Gatherings with Friends and Family: Twice a week    Attends Religious Services: 1 to 4 times per year    Active Member of [employer] or Organizations: No    Attends Engineer, Structural: Not on file    Marital Status: Married  Intimate Partner Violence: Unknown (03/08/2022)   Received from [facility], [facility]   HITS    Physically Hurt: Not on file    Insult or Talk Down To: Not on file    Threaten Physical Harm: Not on file    Scream or Curse: Not on file    Outpatient Medications Prior to Visit  Medication Sig Dispense Refill   acetaminophen  (TYLENOL ) 500 MG tablet Take 500 mg by mouth every 6 (six) hours as needed.     atorvastatin  (LIPITOR) 20 MG tablet Take 1 tablet (20 mg total) by mouth daily. 90 tablet 1   Blood Glucose Monitoring Suppl (CONTOUR NEXT MONITOR) w/Device KIT Check blood sugars daily 1 kit 0   famotidine (PEPCID) 10 MG tablet Take 10 mg by mouth as needed for heartburn or indigestion.     metFORMIN  (GLUCOPHAGE ) 500 MG tablet Take 1 tablet (500 mg total) by mouth 2 (two) times daily with a meal. 180 tablet 1   tirzepatide   (MOUNJARO ) 5 MG/0.5ML Pen Inject 5 mg into the skin once a week. 2 mL 1   No facility-administered medications prior to visit.    Allergies  Allergen Reactions   Effexor  [Venlafaxine ] Other (See Comments)    Severe daytime somnolence, head ache and weight gain   Lexapro  [Escitalopram  Oxalate]     Weight gain    Review of Systems  Constitutional:  Positive for weight loss.  HENT:  Negative  for congestion and hearing loss.   Eyes:  Negative for blurred vision.  Respiratory:  Negative for cough.   Cardiovascular:  Negative for leg swelling.  Gastrointestinal:  Positive for constipation. Negative for diarrhea.  Genitourinary:  Negative for dysuria, frequency and hematuria.  Musculoskeletal:  Negative for joint pain and myalgias.  Skin:  Negative for rash.  Neurological:  Negative for headaches.  Psychiatric/Behavioral:         Denies depression/anxiety       Objective:    Physical Exam   BP 113/77 (BP Location: Left Arm, Patient Position: Sitting, Cuff Size: Large)   Pulse 99   Resp 16   Ht 5' 11 (1.803 m)   Wt 300 lb (136.1 kg)   SpO2 99%   BMI 41.84 kg/m  Wt Readings from Last 3 Encounters:  01/13/24 300 lb (136.1 kg)  12/13/23 (!) 306 lb (138.8 kg)  11/24/22 (!) 316 lb 12.8 oz (143.7 kg)   Lab Results  Component Value Date   PSA 0.81 01/13/2024   PSA 0.65 09/05/2020   PSA 0.58 09/01/2018  Physical Exam  Constitutional: He is oriented to person, place, and time. He appears well-developed and well-nourished. No distress.  HENT:  Head: Normocephalic and atraumatic.  Right Ear: Tympanic membrane and ear canal normal.  Left Ear: Tympanic membrane and ear canal normal.  Mouth/Throat: Oropharynx is clear and moist.  Eyes: Pupils are equal, round, and reactive to light. No scleral icterus.  Neck: Normal range of motion. No thyromegaly present.  Cardiovascular: Normal rate and regular rhythm.   No murmur heard. Pulmonary/Chest: Effort normal and breath sounds  normal. No respiratory distress. He has no wheezes. He has no rales. He exhibits no tenderness.  Abdominal: Soft. Bowel sounds are normal. Protuberant. There is no tenderness. There is no rebound and no guarding.  Musculoskeletal: He exhibits no edema.  Lymphadenopathy:    He has no cervical adenopathy.  Neurological: He is alert and oriented to person, place, and time. He has normal patellar reflexes. He exhibits normal muscle tone. Coordination normal.  Skin: Skin is warm and dry.  Psychiatric: He has a normal mood and affect. His behavior is normal. Judgment and thought content normal.           Assessment & Plan:         Assessment & Plan:   Problem List Items Addressed This Visit       Unprioritized   Type 2 diabetes mellitus with obesity (HCC)   Lab Results  Component Value Date   HGBA1C 8.4 (H) 12/13/2023   Too soon to recheck A1C.  Mounjaro  recently adjusted from 2.5mg  to 5mg .  Plan to repeat A1C in 3 months.       Preventative health care   Continue healthy diet, exercise, weight loss efforts Colo due 2026 Annual eye exam due soon Continue regular dental care Check PSA.      Hyperlipidemia - Primary   Lab Results  Component Value Date   CHOL 174 01/13/2024   HDL 45 01/13/2024   LDLCALC 99 01/13/2024   LDLDIRECT 167.0 11/24/2022   TRIG 210 (H) 01/13/2024   CHOLHDL 3.9 01/13/2024   Follow up lipid panel much improved.  Continue atorvastatin .       Relevant Orders   Lipid panel (Completed)   Fatigue   Requesting testosterone  level.       Relevant Orders   Testosterone  Total,Free,Bio, Males-([facility]) (Completed)   Class 3 severe obesity due to excess calories  with serious comorbidity and body mass index (BMI) of 40.0 to 44.9 in adult [facility])   Doing well on mounjaro , increase dose to 5mg .        Other Visit Diagnoses       Screening for prostate cancer       Relevant Orders   PSA (Completed)       I am having Lamar Wilda Hutchinson maintain  his famotidine, Contour Next Monitor, acetaminophen , metFORMIN , atorvastatin , and tirzepatide .  No orders of the defined types were placed in this encounter.

## 2024-01-14 ENCOUNTER — Telehealth: Payer: Self-pay | Admitting: Family

## 2024-01-14 DIAGNOSIS — R5383 Other fatigue: Secondary | ICD-10-CM | POA: Insufficient documentation

## 2024-01-14 DIAGNOSIS — E291 Testicular hypofunction: Secondary | ICD-10-CM

## 2024-01-14 DIAGNOSIS — Z6841 Body Mass Index (BMI) 40.0 and over, adult: Secondary | ICD-10-CM | POA: Insufficient documentation

## 2024-01-14 LAB — LIPID PANEL
Cholesterol: 174 mg/dL (ref <200)
HDL: 45 mg/dL (ref >=40)
LDL Cholesterol (Calc): 99 mg/dL
Non-HDL Cholesterol (Calc): 129 mg/dL (ref <130)
Total CHOL/HDL Ratio: 3.9 (calc) (ref <5.0)
Triglycerides: 210 mg/dL — ABNORMAL HIGH (ref <150)

## 2024-01-14 LAB — TESTOSTERONE TOTAL,FREE,BIO, MALES
Albumin: 4.6 g/dL (ref 3.6–5.1)
Sex Hormone Binding: 32 nmol/L (ref 10–50)
Testosterone, Bioavailable: 73.1 ng/dL — ABNORMAL LOW (ref 110.0–575.0)
Testosterone, Free: 34.8 pg/mL — ABNORMAL LOW (ref 46.0–224.0)
Testosterone: 272 ng/dL (ref 250–827)

## 2024-01-14 LAB — PSA: PSA: 0.81 ng/mL (ref <=4.00)

## 2024-01-14 NOTE — Assessment & Plan Note (Signed)
 Lab Results  Component Value Date   CHOL 174 01/13/2024   HDL 45 01/13/2024   LDLCALC 99 01/13/2024   LDLDIRECT 167.0 11/24/2022   TRIG 210 (H) 01/13/2024   CHOLHDL 3.9 01/13/2024   Follow up lipid panel much improved.  Continue atorvastatin .

## 2024-01-14 NOTE — Assessment & Plan Note (Signed)
 Lab Results  Component Value Date   HGBA1C 8.4 (H) 12/13/2023   Too soon to recheck A1C.  Mounjaro  recently adjusted from 2.5mg  to 5mg .  Plan to repeat A1C in 3 months.

## 2024-01-14 NOTE — Assessment & Plan Note (Signed)
 Requesting testosterone level.

## 2024-01-14 NOTE — Assessment & Plan Note (Signed)
 Continue healthy diet, exercise, weight loss efforts Colo due 2026 Annual eye exam due soon Continue regular dental care Check PSA.

## 2024-01-14 NOTE — Assessment & Plan Note (Signed)
 Doing well on mounjaro , increase dose to 5mg .

## 2024-01-14 NOTE — Patient Instructions (Signed)
 VISIT SUMMARY:  Today, you came in for your annual physical exam. You reported no major health concerns but mentioned experiencing frequent headaches and fatigue, suspecting low testosterone . You are actively improving your diet and exercise routine and are up to date with your eye exams and dental visits. You recently started taking atorvastatin  for cholesterol and are taking Pepcid as needed.  YOUR PLAN:  -HYPERLIPIDEMIA: Hyperlipidemia means you have high levels of cholesterol in your blood. You recently started taking atorvastatin  to manage this, and we will check your cholesterol levels today to see how well the medication is working.  -FATIGUE: Fatigue means feeling very tired and lacking energy. You suspect this might be due to low testosterone , so we will check your testosterone  levels today.  -PROSTATE CANCER SCREENING: Prostate cancer screening involves checking the health of your prostate gland. It has been a while since your last PSA test, so we will check your PSA levels today.  -GENERAL HEALTH MAINTENANCE: Continue with your healthy diet and exercise routine. Your next colonoscopy is due on November 30, 2024. Your annual eye exam is due soon, and you should continue your regular dental check-ups every four months. Keep taking Pepcid as needed. We will check your sugar levels in three months.  INSTRUCTIONS:  Please follow up in three months to check your sugar levels. Make sure to schedule your annual eye exam soon.

## 2024-01-14 NOTE — Telephone Encounter (Signed)
 Please advise patient that his testosterone  is low. I would like to refer him to Urology for further management.  Also, cholesterol is much better.  Please continue atorvastatin .

## 2024-01-14 NOTE — Telephone Encounter (Signed)
 See Mychart.

## 2024-01-18 NOTE — Telephone Encounter (Signed)
See below

## 2024-01-19 NOTE — Telephone Encounter (Signed)
See below

## 2024-02-02 ENCOUNTER — Ambulatory Visit: Payer: BC Managed Care – PPO | Admitting: Urology

## 2024-02-02 MED ORDER — TIRZEPATIDE 5 MG/0.5ML ~~LOC~~ SOAJ: 5.0000 mg | SUBCUTANEOUS | 1 refills | Status: DC

## 2024-02-02 NOTE — Addendum Note (Signed)
 Addended by: Sandford Craze on: 02/02/2024 07:48 PM   Modules accepted: Orders

## 2024-02-04 DIAGNOSIS — E119 Type 2 diabetes mellitus without complications: Secondary | ICD-10-CM | POA: Diagnosis not present

## 2024-02-08 ENCOUNTER — Other Ambulatory Visit (HOSPITAL_BASED_OUTPATIENT_CLINIC_OR_DEPARTMENT_OTHER): Payer: Self-pay

## 2024-02-08 NOTE — Telephone Encounter (Signed)
 Called the pharmacy to check if prescription was ready. Called patient and let him know he could pick it up

## 2024-02-09 ENCOUNTER — Other Ambulatory Visit

## 2024-02-09 ENCOUNTER — Ambulatory Visit: Payer: BC Managed Care – PPO | Admitting: Urology

## 2024-02-09 ENCOUNTER — Other Ambulatory Visit (HOSPITAL_BASED_OUTPATIENT_CLINIC_OR_DEPARTMENT_OTHER): Payer: Self-pay

## 2024-02-09 ENCOUNTER — Encounter: Payer: Self-pay | Admitting: Urology

## 2024-02-09 VITALS — BP 150/81 | HR 61 | Ht 71.0 in | Wt 283.0 lb

## 2024-02-09 DIAGNOSIS — R7989 Other specified abnormal findings of blood chemistry: Secondary | ICD-10-CM | POA: Diagnosis not present

## 2024-02-09 MED ORDER — TIRZEPATIDE 5 MG/0.5ML ~~LOC~~ SOAJ
5.0000 mg | SUBCUTANEOUS | 1 refills | Status: DC
  Filled 2024-02-09: qty 2, 28d supply, fill #0
  Filled 2024-03-06: qty 2, 28d supply, fill #1

## 2024-02-09 NOTE — Progress Notes (Signed)
 Assessment: 1. Low testosterone     Plan: I personally reviewed the patient's chart including provider notes, and lab results. His recent labs were drawn in the afternoon.  I advised him that we need to check a testosterone level in the morning for accurate results. AM labs:  testosterone, LH, prolactin, CBC I discussed options for management of low testosterone including oral therapy, topical therapy, acting injections, long-acting injections, and subcutaneous pellets.  Potential side effects of testosterone replacement also reviewed. Will contact him with lab results when available.   Chief Complaint:  Chief Complaint  Patient presents with   Hypogonadism    History of Present Illness:  Angel Merritt is a 58 y.o. male who is seen in consultation from Sandford Craze, NP for evaluation of low testosterone. He reported symptoms of fatigue and lack of energy as well as difficulty with weight loss.  His symptoms been present for several months.  He does not have any decreased libido or problems with erectile dysfunction. ADAM score = 2/10  Labs from 01/14/2024: (drawn at 1530) Testosterone 272 Free testosterone 34.8 Bioavailable testosterone 73.1  PSA a from 01/13/2024: 0.81  No lower urinary tract symptoms.  Past Medical History:  Past Medical History:  Diagnosis Date   B12 deficiency 09/07/2020   Diabetes mellitus type 2, controlled (HCC) 09/09/2021   ED (erectile dysfunction)    GERD (gastroesophageal reflux disease)    Hyperlipidemia    OA (osteoarthritis)    OSA (obstructive sleep apnea) 10/15/2021   Umbilical hernia    Varicose veins    Wears glasses     Past Surgical History:  Past Surgical History:  Procedure Laterality Date   COLONOSCOPY WITH PROPOFOL  11-07-2020  dr nandigam   ENDOVENOUS ABLATION SAPHENOUS VEIN W/ LASER Left 11-06-2015   endovenous laser ablation left greater saphenous vein and stab phlebectomies > 20 incisions  left leg  by Gretta Began  MD   UMBILICAL HERNIA REPAIR N/A 11/21/2020   Procedure: LAPAROSCOPIC UMBILICAL HERNIA WITH MESH;  Surgeon: Axel Filler, MD;  Location: [facility];  Service: General;  Laterality: N/A;    Allergies:  Allergies  Allergen Reactions   Effexor [Venlafaxine] Other (See Comments)    Severe daytime somnolence, head ache and weight gain   Lexapro [Escitalopram Oxalate]     Weight gain    Family History:  Family History  Problem Relation Age of Onset   Coronary artery disease Mother 65       (has 3 stents)   Heart disease Mother    Colon cancer Father 46       died at 32 (following a hernia repair)   Cancer Father    Coronary artery disease Maternal Grandmother 98       smoker   Cancer Paternal Grandfather        brain tumor   Esophageal cancer Neg Hx    Stomach cancer Neg Hx    Rectal cancer Neg Hx     Social History:  Social History   Tobacco Use   Smoking status: Never   Smokeless tobacco: Never  Vaping Use   Vaping status: Never Used  Substance Use Topics   Alcohol use: Not Currently   Drug use: Never    Review of symptoms:  Constitutional:  Negative for unexplained weight loss, night sweats, fever, chills ENT:  Negative for nose bleeds, sinus pain, painful swallowing CV:  Negative for chest pain, shortness of breath, exercise intolerance, palpitations, loss of consciousness Resp:  Negative for cough, wheezing, shortness of breath GI:  Negative for nausea, vomiting, diarrhea, bloody stools GU:  Positives noted in HPI; otherwise negative for gross hematuria, dysuria, urinary incontinence Neuro:  Negative for seizures, poor balance, limb weakness, slurred speech Psych:  Negative for lack of energy, depression, anxiety Endocrine:  Negative for polydipsia, polyuria, symptoms of hypoglycemia (dizziness, hunger, sweating) Hematologic:  Negative for anemia, purpura, petechia, prolonged or excessive bleeding, use of anticoagulants  Allergic:  Negative  for difficulty breathing or choking as a result of exposure to anything; no shellfish allergy; no allergic response (rash/itch) to materials, foods  Physical exam: BP (!) 150/81   Pulse 61   Ht 5\' 11"  (1.803 m)   Wt 283 lb (128.4 kg)   BMI 39.47 kg/m  GENERAL APPEARANCE:  Well appearing, well developed, well nourished, NAD HEENT: Atraumatic, Normocephalic, oropharynx clear. NECK: Supple without lymphadenopathy or thyromegaly. LUNGS: Clear to auscultation bilaterally. HEART: Regular Rate and Rhythm without murmurs, gallops, or rubs. ABDOMEN: Soft, non-tender, No Masses. EXTREMITIES: Moves all extremities well.  Without clubbing, cyanosis, or edema. NEUROLOGIC:  Alert and oriented x 3, normal gait, CN II-XII grossly intact.  MENTAL STATUS:  Appropriate. BACK:  Non-tender to palpation.  No CVAT SKIN:  Warm, dry and intact.   GU: Penis:  circumcised Meatus: Normal Scrotum: normal, no masses Testis: normal without masses bilateral   Results: None

## 2024-02-09 NOTE — Addendum Note (Signed)
 Addended by: Conrad Calion D on: 02/09/2024 09:56 AM   Modules accepted: Orders

## 2024-02-10 ENCOUNTER — Other Ambulatory Visit

## 2024-02-10 DIAGNOSIS — R7989 Other specified abnormal findings of blood chemistry: Secondary | ICD-10-CM

## 2024-02-11 LAB — CBC
Hematocrit: 45.2 % (ref 37.5–51.0)
Hemoglobin: 15 g/dL (ref 13.0–17.7)
MCH: 30 pg (ref 26.6–33.0)
MCHC: 33.2 g/dL (ref 31.5–35.7)
MCV: 90 fL (ref 79–97)
Platelets: 355 10*3/uL (ref 150–450)
RBC: 5 x10E6/uL (ref 4.14–5.80)
RDW: 12.7 % (ref 11.6–15.4)
WBC: 9.5 10*3/uL (ref 3.4–10.8)

## 2024-02-11 LAB — TESTOSTERONE: Testosterone: 535 ng/dL (ref 264–916)

## 2024-02-11 LAB — ESTRADIOL: Estradiol: 49.4 pg/mL — ABNORMAL HIGH (ref 7.6–42.6)

## 2024-02-11 LAB — LUTEINIZING HORMONE: LH: 6.9 m[IU]/mL (ref 1.7–8.6)

## 2024-02-11 LAB — PROLACTIN: Prolactin: 7.1 ng/mL (ref 3.6–25.2)

## 2024-02-13 ENCOUNTER — Encounter: Payer: Self-pay | Admitting: Urology

## 2024-03-06 ENCOUNTER — Other Ambulatory Visit (HOSPITAL_BASED_OUTPATIENT_CLINIC_OR_DEPARTMENT_OTHER): Payer: Self-pay

## 2024-03-06 DIAGNOSIS — E119 Type 2 diabetes mellitus without complications: Secondary | ICD-10-CM | POA: Diagnosis not present

## 2024-04-05 ENCOUNTER — Other Ambulatory Visit: Payer: Self-pay | Admitting: Family

## 2024-04-05 ENCOUNTER — Other Ambulatory Visit (HOSPITAL_BASED_OUTPATIENT_CLINIC_OR_DEPARTMENT_OTHER): Payer: Self-pay

## 2024-04-05 DIAGNOSIS — E119 Type 2 diabetes mellitus without complications: Secondary | ICD-10-CM | POA: Diagnosis not present

## 2024-04-06 ENCOUNTER — Other Ambulatory Visit (HOSPITAL_BASED_OUTPATIENT_CLINIC_OR_DEPARTMENT_OTHER): Payer: Self-pay

## 2024-04-06 MED ORDER — MOUNJARO 5 MG/0.5ML ~~LOC~~ SOAJ
5.0000 mg | SUBCUTANEOUS | 1 refills | Status: DC
  Filled 2024-04-06: qty 2, 28d supply, fill #0

## 2024-04-10 DIAGNOSIS — E119 Type 2 diabetes mellitus without complications: Secondary | ICD-10-CM | POA: Diagnosis not present

## 2024-04-10 LAB — HM DIABETES EYE EXAM

## 2024-04-11 ENCOUNTER — Telehealth: Payer: Self-pay | Admitting: Family

## 2024-04-11 ENCOUNTER — Ambulatory Visit: Payer: BC Managed Care – PPO | Admitting: Family

## 2024-04-11 ENCOUNTER — Other Ambulatory Visit (HOSPITAL_BASED_OUTPATIENT_CLINIC_OR_DEPARTMENT_OTHER): Payer: Self-pay

## 2024-04-11 VITALS — BP 109/65 | HR 76 | Temp 98.0°F | Resp 16 | Ht 71.0 in | Wt 281.0 lb

## 2024-04-11 DIAGNOSIS — I10 Essential (primary) hypertension: Secondary | ICD-10-CM

## 2024-04-11 DIAGNOSIS — G4733 Obstructive sleep apnea (adult) (pediatric): Secondary | ICD-10-CM | POA: Diagnosis not present

## 2024-04-11 DIAGNOSIS — E785 Hyperlipidemia, unspecified: Secondary | ICD-10-CM | POA: Diagnosis not present

## 2024-04-11 DIAGNOSIS — E669 Obesity, unspecified: Secondary | ICD-10-CM | POA: Diagnosis not present

## 2024-04-11 DIAGNOSIS — E1169 Type 2 diabetes mellitus with other specified complication: Secondary | ICD-10-CM

## 2024-04-11 DIAGNOSIS — K219 Gastro-esophageal reflux disease without esophagitis: Secondary | ICD-10-CM

## 2024-04-11 DIAGNOSIS — Z7985 Long-term (current) use of injectable non-insulin antidiabetic drugs: Secondary | ICD-10-CM

## 2024-04-11 MED ORDER — TIRZEPATIDE 7.5 MG/0.5ML ~~LOC~~ SOAJ
7.5000 mg | SUBCUTANEOUS | 2 refills | Status: DC
  Filled 2024-04-11 – 2024-05-01 (×2): qty 2, 28d supply, fill #0
  Filled 2024-05-24: qty 2, 28d supply, fill #1
  Filled 2024-06-21: qty 2, 28d supply, fill #2

## 2024-04-11 NOTE — Progress Notes (Signed)
 Subjective:     Patient ID: Angel Merritt, male    DOB: 06/18/1966, 58 y.o.   MRN: [id number]  Chief Complaint  Patient presents with   Diabetes    Here for follow up    Diabetes    Discussed the use of AI scribe software for clinical note transcription with the patient, who gave verbal consent to proceed.  History of Present Illness   Angel Cubias "Rozelle Corning" is a 58 year old male with diabetes who presents for a follow-up on his medications and blood sugar control.  He is taking Mounjaro  and initially experienced nausea and constipation, which have resolved. His appetite is no longer suppressed, and his weight has stabilized at 277 pounds, down from 280 pounds. He is open to adjusting his medication to aid further weight loss.  He is not using his BiPAP machine, stating he is sleeping fine. He had a sleep study in 2023 and saw a sleep specialist but is not interested in follow-up.  A recent eye exam at [facility] in [facility] reportedly showed no signs of retinopathy or diabetes-related changes. His right eye has improved, and he did not need new glasses.   He has no reflux symptoms and continues to take Pepcid once daily, purchased over the counter.    Health Maintenance Due  Topic Date Due   Pneumococcal Vaccine 69-21 Years old (1 of 2 - PCV) Never done   OPHTHALMOLOGY EXAM  02/29/2024    Past Medical History:  Diagnosis Date   B12 deficiency 09/07/2020   Diabetes mellitus type 2, controlled (HCC) 09/09/2021   ED (erectile dysfunction)    GERD (gastroesophageal reflux disease)    Hyperlipidemia    OA (osteoarthritis)    OSA (obstructive sleep apnea) 10/15/2021   Umbilical hernia    Varicose veins    Wears glasses     Past Surgical History:  Procedure Laterality Date   COLONOSCOPY WITH PROPOFOL   11-07-2020  dr Leonia Raman   ENDOVENOUS ABLATION SAPHENOUS VEIN W/ LASER Left 11-06-2015   endovenous laser ablation left greater saphenous vein and stab phlebectomies >  20 incisions  left leg  by Ouida Bloom MD   UMBILICAL HERNIA REPAIR N/A 11/21/2020   Procedure: LAPAROSCOPIC UMBILICAL HERNIA WITH MESH;  Surgeon: Shela Derby, MD;  Location: [facility];  Service: General;  Laterality: N/A;    Family History  Problem Relation Age of Onset   Coronary artery disease Mother 74       (has 3 stents)   Heart disease Mother    Colon cancer Father 53       died at 64 (following a hernia repair)   Cancer Father    Coronary artery disease Maternal Grandmother 32       smoker   Cancer Paternal Grandfather        brain tumor   Esophageal cancer Neg Hx    Stomach cancer Neg Hx    Rectal cancer Neg Hx     Social History   Socioeconomic History   Marital status: Married    Spouse name: Not on file   Number of children: Not on file   Years of education: Not on file   Highest education level: Associate degree: occupational, Scientist, product/process development, or vocational program  Occupational History   Not on file  Tobacco Use   Smoking status: Never   Smokeless tobacco: Never  Vaping Use   Vaping status: Never Used  Substance and Sexual Activity   Alcohol use: Not  Currently   Drug use: Never   Sexual activity: Not on file    Comment: vasectomy  2017  Other Topics Concern   Not on file  Social History Narrative   4 children- 43 (daughter), 81 (daughter), 8 son, 5 son   Works as Curator   Married   Enjoys baseball, golf, gym   Social Drivers of Corporate investment banker Strain: High Risk (01/11/2024)   Overall Financial Resource Strain (CARDIA)    Difficulty of Paying Living Expenses: Hard  Food Insecurity: Food Insecurity Present (01/11/2024)   Hunger Vital Sign    Worried About Running Out of Food in the Last Year: Sometimes true    Ran Out of Food in the Last Year: Never true  Transportation Needs: No Transportation Needs (01/11/2024)   PRAPARE - Administrator, Civil Service (Medical): No    Lack of Transportation (Non-Medical): No   Physical Activity: Insufficiently Active (01/11/2024)   Exercise Vital Sign    Days of Exercise per Week: 6 days    Minutes of Exercise per Session: 20 min  Stress: No Stress Concern Present (01/11/2024)   [employer] of Occupational Health - Occupational Stress Questionnaire    Feeling of Stress : Not at all  Social Connections: Moderately Integrated (01/11/2024)   Social Connection and Isolation Panel [NHANES]    Frequency of Communication with Friends and Family: Twice a week    Frequency of Social Gatherings with Friends and Family: Twice a week    Attends Religious Services: 1 to 4 times per year    Active Member of [employer] or Organizations: No    Attends Engineer, structural: Not on file    Marital Status: Married  Intimate Partner Violence: Unknown (03/08/2022)   Received from [employer]   HITS    Physically Hurt: Not on file    Insult or Talk Down To: Not on file    Threaten Physical Harm: Not on file    Scream or Curse: Not on file    Outpatient Medications Prior to Visit  Medication Sig Dispense Refill   acetaminophen  (TYLENOL ) 500 MG tablet Take 500 mg by mouth every 6 (six) hours as needed.     atorvastatin  (LIPITOR) 20 MG tablet Take 1 tablet (20 mg total) by mouth daily. 90 tablet 1   Blood Glucose Monitoring Suppl (CONTOUR NEXT MONITOR) w/Device KIT Check blood sugars daily 1 kit 0   famotidine (PEPCID) 10 MG tablet Take 10 mg by mouth as needed for heartburn or indigestion.     metFORMIN  (GLUCOPHAGE ) 500 MG tablet Take 1 tablet (500 mg total) by mouth 2 (two) times daily with a meal. 180 tablet 1   tirzepatide  (MOUNJARO ) 5 MG/0.5ML Pen Inject 5 mg into the skin once a week. 2 mL 1   No facility-administered medications prior to visit.    Allergies  Allergen Reactions   Effexor  [Venlafaxine ] Other (See Comments)    Severe daytime somnolence, head ache and weight gain   Lexapro  [Escitalopram  Oxalate]     Weight gain    ROS    See  HPI Objective:    Physical Exam Constitutional:      General: He is not in acute distress.    Appearance: He is well-developed.  HENT:     Head: Normocephalic and atraumatic.  Cardiovascular:     Rate and Rhythm: Normal rate and regular rhythm.     Heart sounds: No murmur heard. Pulmonary:  Effort: Pulmonary effort is normal. No respiratory distress.     Breath sounds: Normal breath sounds. No wheezing or rales.  Skin:    General: Skin is warm and dry.  Neurological:     Mental Status: He is alert and oriented to person, place, and time.  Psychiatric:        Behavior: Behavior normal.        Thought Content: Thought content normal.      BP 109/65 (BP Location: Left Arm, Patient Position: Sitting, Cuff Size: Large)   Pulse 76   Temp 98 F (36.7 C) (Oral)   Resp 16   Ht 5\' 11"  (1.803 m)   Wt 281 lb (127.5 kg)   SpO2 100%   BMI 39.19 kg/m  Wt Readings from Last 3 Encounters:  04/11/24 281 lb (127.5 kg)  02/09/24 283 lb (128.4 kg)  01/13/24 300 lb (136.1 kg)       Assessment & Plan:   Problem List Items Addressed This Visit       Unprioritized   Type 2 diabetes mellitus with obesity (HCC) - Primary    Managed with Mounjaro  and metformin . Weight has plateaued. No diabetic retinopathy. Agreed to increase Mounjaro  for weight loss and glycemic control. - Increase Mounjaro  to 7.5 mg to enhance weight loss and glycemic control. - Order A1c test to monitor diabetes control. - Performed foot exam to check for diabetic complications. - Call [facility] in [place] to obtain eye exam report.      Relevant Medications   tirzepatide  (MOUNJARO ) 7.5 MG/0.5ML Pen   Other Relevant Orders   HgB A1c   Basic Metabolic Panel (BMET)   OSA (obstructive sleep apnea)   Declines treatment at this time. Discussed CV risks of untreated OSA.      Hyperlipidemia   Lab Results  Component Value Date   CHOL 174 01/13/2024   HDL 45 01/13/2024   LDLCALC 99 01/13/2024    LDLDIRECT 167.0 11/24/2022   TRIG 210 (H) 01/13/2024   CHOLHDL 3.9 01/13/2024   Maintained on lipitor. Update lipid panel.      Relevant Orders   Lipid panel   GERD   Stable with otc pepcid once daily.       Essential hypertension   Looks good without medication. BP Readings from Last 3 Encounters:  04/11/24 109/65  02/09/24 (!) 150/81  01/13/24 113/77          I have discontinued Angel Merritt "Dean"'s Mounjaro . I am also having him start on tirzepatide . Additionally, I am having him maintain his famotidine, Contour Next Monitor, acetaminophen , metFORMIN , and atorvastatin .  Meds ordered this encounter  Medications   tirzepatide  (MOUNJARO ) 7.5 MG/0.5ML Pen    Sig: Inject 7.5 mg into the skin once a week.    Dispense:  2 mL    Refill:  2    Supervising Provider:   Randie Bustle A [[id number]]

## 2024-04-11 NOTE — Assessment & Plan Note (Addendum)
 Lab Results  Component Value Date   CHOL 174 01/13/2024   HDL 45 01/13/2024   LDLCALC 99 01/13/2024   LDLDIRECT 167.0 11/24/2022   TRIG 210 (H) 01/13/2024   CHOLHDL 3.9 01/13/2024   Maintained on lipitor. Update lipid panel.

## 2024-04-11 NOTE — Assessment & Plan Note (Signed)
 Stable with otc pepcid once daily.

## 2024-04-11 NOTE — Assessment & Plan Note (Signed)
  Managed with Mounjaro  and metformin . Weight has plateaued. No diabetic retinopathy. Agreed to increase Mounjaro  for weight loss and glycemic control. - Increase Mounjaro  to 7.5 mg to enhance weight loss and glycemic control. - Order A1c test to monitor diabetes control. - Performed foot exam to check for diabetic complications. - Call [facility] in [place] to obtain eye exam report.

## 2024-04-11 NOTE — Patient Instructions (Signed)
 VISIT SUMMARY:  Today, we reviewed your diabetes management, weight, and other health concerns. We made some adjustments to your medication to help with weight loss and blood sugar control. We also discussed your sleep apnea and current use of Pepcid for reflux.  YOUR PLAN:  TYPE 2 DIABETES MELLITUS: Your diabetes is currently managed with Mounjaro  and metformin . Your weight has stabilized, and there are no signs of diabetic retinopathy. -Increase Mounjaro  to 7.5 mg to help with weight loss and blood sugar control. -We will order an A1c test to monitor your diabetes control. -We performed a foot exam to check for any diabetic complications. -[facility] to get your eye exam report.  OBESITY: Your weight has plateaued on the 5mg  of Mounjaro . -Increase Mounjaro  to 7.5 mg to help with further weight loss. -Continue your daily exercise routine.  OBSTRUCTIVE SLEEP APNEA: You have been evaluated for sleep apnea but decline the use of BIPAP treatment. -Consider using your BiPAP machine as it can help protect your heart.  GASTROESOPHAGEAL REFLUX DISEASE (GERD): You are managing your reflux with daily over-the-counter Pepcid and have no current symptoms. -Continue taking Pepcid daily as needed.  HYPERTENSION, RESOLVED: Your blood pressure has improved with weight loss and is currently at 119/unknown. -Continue with your current lifestyle changes to maintain your blood pressure.

## 2024-04-11 NOTE — Assessment & Plan Note (Signed)
 Looks good without medication. BP Readings from Last 3 Encounters:  04/11/24 109/65  02/09/24 (!) 150/81  01/13/24 113/77

## 2024-04-11 NOTE — Assessment & Plan Note (Addendum)
 Declines treatment at this time. Discussed CV risks of untreated OSA.

## 2024-04-11 NOTE — Telephone Encounter (Signed)
 Please request dm eye exam from [facility] in [place].

## 2024-04-12 ENCOUNTER — Encounter: Payer: Self-pay | Admitting: Family

## 2024-04-12 LAB — BASIC METABOLIC PANEL WITH GFR
BUN: 21 mg/dL (ref 6–23)
CO2: 28 meq/L (ref 19–32)
Calcium: 9.5 mg/dL (ref 8.4–10.5)
Chloride: 100 meq/L (ref 96–112)
Creatinine, Ser: 1.11 mg/dL (ref 0.40–1.50)
GFR: 74.69 mL/min (ref >60.00)
Glucose, Bld: 94 mg/dL (ref 70–99)
Potassium: 4.2 meq/L (ref 3.5–5.1)
Sodium: 138 meq/L (ref 135–145)

## 2024-04-12 LAB — LIPID PANEL
Cholesterol: 149 mg/dL (ref 0–200)
HDL: 39.7 mg/dL (ref >39.00)
LDL Cholesterol: 81 mg/dL (ref 0–99)
NonHDL: 109.15
Total CHOL/HDL Ratio: 4
Triglycerides: 142 mg/dL (ref 0.0–149.0)
VLDL: 28.4 mg/dL (ref 0.0–40.0)

## 2024-04-12 LAB — HEMOGLOBIN A1C: Hgb A1c MFr Bld: 6.4 % (ref 4.6–6.5)

## 2024-04-12 NOTE — Telephone Encounter (Signed)
 No [facility] in [place], per patient correct name is [facility]. Electronic request sent.

## 2024-05-01 ENCOUNTER — Other Ambulatory Visit (HOSPITAL_BASED_OUTPATIENT_CLINIC_OR_DEPARTMENT_OTHER): Payer: Self-pay

## 2024-05-06 DIAGNOSIS — E119 Type 2 diabetes mellitus without complications: Secondary | ICD-10-CM | POA: Diagnosis not present

## 2024-06-05 DIAGNOSIS — E119 Type 2 diabetes mellitus without complications: Secondary | ICD-10-CM | POA: Diagnosis not present

## 2024-06-06 ENCOUNTER — Other Ambulatory Visit: Payer: Self-pay | Admitting: Family

## 2024-07-06 DIAGNOSIS — E119 Type 2 diabetes mellitus without complications: Secondary | ICD-10-CM | POA: Diagnosis not present

## 2024-07-17 ENCOUNTER — Ambulatory Visit: Admitting: Family

## 2024-07-17 ENCOUNTER — Other Ambulatory Visit (HOSPITAL_BASED_OUTPATIENT_CLINIC_OR_DEPARTMENT_OTHER): Payer: Self-pay

## 2024-07-17 VITALS — BP 124/77 | HR 72 | Temp 98.7°F | Resp 16 | Ht 71.0 in | Wt 277.0 lb

## 2024-07-17 DIAGNOSIS — Z23 Encounter for immunization: Secondary | ICD-10-CM | POA: Diagnosis not present

## 2024-07-17 DIAGNOSIS — E785 Hyperlipidemia, unspecified: Secondary | ICD-10-CM | POA: Diagnosis not present

## 2024-07-17 DIAGNOSIS — G4733 Obstructive sleep apnea (adult) (pediatric): Secondary | ICD-10-CM | POA: Diagnosis not present

## 2024-07-17 DIAGNOSIS — E1169 Type 2 diabetes mellitus with other specified complication: Secondary | ICD-10-CM

## 2024-07-17 DIAGNOSIS — E669 Obesity, unspecified: Secondary | ICD-10-CM | POA: Diagnosis not present

## 2024-07-17 DIAGNOSIS — I10 Essential (primary) hypertension: Secondary | ICD-10-CM | POA: Diagnosis not present

## 2024-07-17 MED ORDER — TIRZEPATIDE 7.5 MG/0.5ML ~~LOC~~ SOAJ
7.5000 mg | SUBCUTANEOUS | 2 refills | Status: DC
  Filled 2024-07-17: qty 2, 28d supply, fill #0
  Filled 2024-08-12 – 2024-08-14 (×2): qty 2, 28d supply, fill #1
  Filled 2024-09-09 – 2024-09-11 (×2): qty 2, 28d supply, fill #2

## 2024-07-17 NOTE — Addendum Note (Signed)
 Addended by: WELLS LEVORN HERO on: 07/17/2024 04:26 PM   Modules accepted: Orders

## 2024-07-17 NOTE — Patient Instructions (Signed)
 VISIT SUMMARY:  Today, you had a follow-up visit to check on your diabetes, cholesterol, and other health concerns. Your diabetes and cholesterol are well-controlled with your current medications. We also discussed your gastroesophageal reflux disease and sleep apnea. You received some vaccinations to keep you up to date.  YOUR PLAN:  TYPE 2 DIABETES MELLITUS: Your diabetes is well-controlled with your current medications, and your A1c has improved significantly. -Recheck your A1c today. -Continue taking Mounjaro . -Continue taking metformin . -If your A1c is 6% or less, we may consider discontinuing metformin .  HYPERLIPIDEMIA: Your cholesterol levels are well-managed with atorvastatin . -Continue taking atorvastatin .  GASTROESOPHAGEAL REFLUX DISEASE: Your reflux symptoms may have improved with weight loss. -Consider stopping Pepcid to see if your symptoms are still controlled.  OBSTRUCTIVE SLEEP APNEA: You are not using CPAP due to reduced sleep. -No changes were made to your current management.  GENERAL HEALTH MAINTENANCE: You are due for some vaccinations. -You received a tetanus vaccine today. -You received a pneumonia vaccine today. -We encourage you to get a flu shot in the fall.

## 2024-07-17 NOTE — Assessment & Plan Note (Signed)
 Lab Results  Component Value Date   HGBA1C 6.4 04/11/2024   HGBA1C 8.4 (H) 12/13/2023   HGBA1C 10.2 (H) 11/24/2022   Lab Results  Component Value Date   LDLCALC 81 04/11/2024   CREATININE 1.11 04/11/2024  Much improved with mounjaro - continue along with metformin . Update A1C.

## 2024-07-17 NOTE — Progress Notes (Signed)
 Subjective:     Patient ID: Angel Merritt, male    DOB: [birth year], 57 y.o.   MRN: [id number]  Chief Complaint  Patient presents with   Diabetes    Here for follow up    Diabetes    Discussed the use of AI scribe software for clinical note transcription with the patient, who gave verbal consent to proceed.  History of Present Illness   Angel Merritt is a 58 year old male with diabetes and hyperlipidemia who presents for a three-month follow-up.  He has no new concerns since his last visit. He notes a slight weight decrease, attributing discrepancies in weight measurements to clothing and time of day. He is currently taking Mounjaro  and metformin  for diabetes management and feels well on these medications. His last A1c was 6.4, down from 8.4, and he is due for a recheck today. He experiences no symptoms of hypoglycemia.  For hyperlipidemia, he is taking atorvastatin  with a recent cholesterol reading of 87, consistent with previous results. He is not on any medication for blood pressure and attributes health improvements to regular morning workouts. He has a three-month supply of Mounjaro  and a large supply of metformin .    Health Maintenance Due  Topic Date Due   Diabetic kidney evaluation - Urine ACR  Never done   Pneumococcal Vaccine: 19-49 Years (1 of 2 - PCV) Never done   Pneumococcal Vaccine: 50+ Years (1 of 2 - PCV) Never done   DTaP/Tdap/Td (3 - Td or Tdap) 05/29/2024   INFLUENZA VACCINE  07/06/2024    Past Medical History:  Diagnosis Date   B12 deficiency 09/07/2020   Diabetes mellitus type 2, controlled (HCC) 09/09/2021   ED (erectile dysfunction)    GERD (gastroesophageal reflux disease)    Hyperlipidemia    OA (osteoarthritis)    OSA (obstructive sleep apnea) 10/15/2021   Umbilical hernia    Varicose veins    Wears glasses     Past Surgical History:  Procedure Laterality Date   COLONOSCOPY WITH PROPOFOL   11-07-2020  dr nandigam   ENDOVENOUS  ABLATION SAPHENOUS VEIN W/ LASER Left 11-06-2015   endovenous laser ablation left greater saphenous vein and stab phlebectomies > 20 incisions  left leg  by Krystal Doing MD   UMBILICAL HERNIA REPAIR N/A 11/21/2020   Procedure: LAPAROSCOPIC UMBILICAL HERNIA WITH MESH;  Surgeon: Rubin Calamity, MD;  Location: [facility];  Service: General;  Laterality: N/A;    Family History  Problem Relation Age of Onset   Coronary artery disease Mother 38       (has 3 stents)   Heart disease Mother    Colon cancer Father 75       died at 65 (following a hernia repair)   Cancer Father    Coronary artery disease Maternal Grandmother 31       smoker   Cancer Paternal Grandfather        brain tumor   Esophageal cancer Neg Hx    Stomach cancer Neg Hx    Rectal cancer Neg Hx     Social History   Socioeconomic History   Marital status: Married    Spouse name: Not on file   Number of children: Not on file   Years of education: Not on file   Highest education level: Associate degree: occupational, Scientist, product/process development, or vocational program  Occupational History   Not on file  Tobacco Use   Smoking status: Never   Smokeless tobacco: Never  Vaping  Use   Vaping status: Never Used  Substance and Sexual Activity   Alcohol use: Not Currently   Drug use: Never   Sexual activity: Not on file    Comment: vasectomy  2017  Other Topics Concern   Not on file  Social History Narrative   4 children- 66 (daughter), 70 (daughter), 8 son, 5 son   Works as Curator   Married   Enjoys baseball, golf, gym   Social Drivers of Health   Financial Resource Strain: Patient Declined (07/10/2024)   Overall Financial Resource Strain (CARDIA)    Difficulty of Paying Living Expenses: Patient declined  Food Insecurity: Patient Declined (07/10/2024)   Hunger Vital Sign    Worried About Running Out of Food in the Last Year: Patient declined    Ran Out of Food in the Last Year: Patient declined  Transportation  Needs: Patient Declined (07/10/2024)   PRAPARE - Administrator, Civil Service (Medical): Patient declined    Lack of Transportation (Non-Medical): Patient declined  Physical Activity: Sufficiently Active (07/10/2024)   Exercise Vital Sign    Days of Exercise per Week: 7 days    Minutes of Exercise per Session: 90 min  Stress: No Stress Concern Present (07/10/2024)   [employer] of Occupational Health - Occupational Stress Questionnaire    Feeling of Stress: Not at all  Social Connections: Moderately Isolated (07/10/2024)   Social Connection and Isolation Panel    Frequency of Communication with Friends and Family: More than three times a week    Frequency of Social Gatherings with Friends and Family: Once a week    Attends Religious Services: Patient declined    Database administrator or Organizations: No    Attends Engineer, structural: Not on file    Marital Status: Married  Intimate Partner Violence: Unknown (03/08/2022)   Received from [facility]   HITS    Physically Hurt: Not on file    Insult or Talk Down To: Not on file    Threaten Physical Harm: Not on file    Scream or Curse: Not on file    Outpatient Medications Prior to Visit  Medication Sig Dispense Refill   acetaminophen  (TYLENOL ) 500 MG tablet Take 500 mg by mouth every 6 (six) hours as needed.     atorvastatin  (LIPITOR) 20 MG tablet TAKE 1 TABLET BY MOUTH EVERY DAY 90 tablet 1   Blood Glucose Monitoring Suppl (CONTOUR NEXT MONITOR) w/Device KIT Check blood sugars daily 1 kit 0   famotidine (PEPCID) 10 MG tablet Take 10 mg by mouth as needed for heartburn or indigestion.     metFORMIN  (GLUCOPHAGE ) 500 MG tablet TAKE 1 TABLET BY MOUTH 2 TIMES DAILY WITH A MEAL. 180 tablet 1   tirzepatide  (MOUNJARO ) 7.5 MG/0.5ML Pen Inject 7.5 mg into the skin once a week. 2 mL 2   No facility-administered medications prior to visit.    Allergies  Allergen Reactions   Effexor  [Venlafaxine ] Other (See Comments)     Severe daytime somnolence, head ache and weight gain   Lexapro  [Escitalopram  Oxalate]     Weight gain    ROS    See HPI  Objective:    Physical Exam Constitutional:      General: He is not in acute distress.    Appearance: He is well-developed.  HENT:     Head: Normocephalic and atraumatic.  Cardiovascular:     Rate and Rhythm: Normal rate and regular rhythm.  Heart sounds: No murmur heard. Pulmonary:     Effort: Pulmonary effort is normal. No respiratory distress.     Breath sounds: Normal breath sounds. No wheezing or rales.  Skin:    General: Skin is warm and dry.  Neurological:     Mental Status: He is alert and oriented to person, place, and time.  Psychiatric:        Behavior: Behavior normal.        Thought Content: Thought content normal.      BP 124/77 (BP Location: Right Arm, Patient Position: Sitting, Cuff Size: Large)   Pulse 72   Temp 98.7 F (37.1 C) (Oral)   Resp 16   Ht 5' 11 (1.803 m)   Wt 277 lb (125.6 kg)   SpO2 99%   BMI 38.63 kg/m  Wt Readings from Last 3 Encounters:  07/17/24 277 lb (125.6 kg)  04/11/24 281 lb (127.5 kg)  02/09/24 283 lb (128.4 kg)       Assessment & Plan:   Problem List Items Addressed This Visit       Unprioritized   Type 2 diabetes mellitus with obesity (HCC) - Primary   Lab Results  Component Value Date   HGBA1C 6.4 04/11/2024   HGBA1C 8.4 (H) 12/13/2023   HGBA1C 10.2 (H) 11/24/2022   Lab Results  Component Value Date   LDLCALC 81 04/11/2024   CREATININE 1.11 04/11/2024  Much improved with mounjaro - continue along with metformin . Update A1C.        Relevant Medications   tirzepatide  (MOUNJARO ) 7.5 MG/0.5ML Pen   Other Relevant Orders   HgB A1c   Urine Microalbumin w/creat. ratio   OSA (obstructive sleep apnea)   Declines CPAP, continues to focus on weight loss/exercise.      Hyperlipidemia   Lab Results  Component Value Date   CHOL 149 04/11/2024   HDL 39.70 04/11/2024   LDLCALC 81  04/11/2024   LDLDIRECT 167.0 11/24/2022   TRIG 142.0 04/11/2024   CHOLHDL 4 04/11/2024   Lipids stable on atorvastatin . Continue same.       Essential hypertension   BP Readings from Last 3 Encounters:  07/17/24 124/77  04/11/24 109/65  02/09/24 (!) 150/81   Stable without medication.       Relevant Orders   Basic Metabolic Panel (BMET)    I am having Angel Merritt maintain his famotidine, Contour Next Monitor, acetaminophen , metFORMIN , atorvastatin , and tirzepatide .  Meds ordered this encounter  Medications   tirzepatide  (MOUNJARO ) 7.5 MG/0.5ML Pen    Sig: Inject 7.5 mg into the skin once a week.    Dispense:  2 mL    Refill:  2    Supervising Provider:   DOMENICA BLACKBIRD A [[id number]]

## 2024-07-17 NOTE — Assessment & Plan Note (Signed)
 Lab Results  Component Value Date   CHOL 149 04/11/2024   HDL 39.70 04/11/2024   LDLCALC 81 04/11/2024   LDLDIRECT 167.0 11/24/2022   TRIG 142.0 04/11/2024   CHOLHDL 4 04/11/2024   Lipids stable on atorvastatin . Continue same.

## 2024-07-17 NOTE — Assessment & Plan Note (Signed)
 BP Readings from Last 3 Encounters:  07/17/24 124/77  04/11/24 109/65  02/09/24 (!) 150/81   Stable without medication.

## 2024-07-17 NOTE — Assessment & Plan Note (Addendum)
 Declines CPAP, continues to focus on weight loss/exercise.

## 2024-07-18 ENCOUNTER — Ambulatory Visit: Payer: Self-pay | Admitting: Family

## 2024-07-18 LAB — BASIC METABOLIC PANEL WITH GFR
BUN: 15 mg/dL (ref 6–23)
CO2: 28 meq/L (ref 19–32)
Calcium: 9.5 mg/dL (ref 8.4–10.5)
Chloride: 101 meq/L (ref 96–112)
Creatinine, Ser: 1.12 mg/dL (ref 0.40–1.50)
GFR: 73.75 mL/min (ref >60.00)
Glucose, Bld: 77 mg/dL (ref 70–99)
Potassium: 4.6 meq/L (ref 3.5–5.1)
Sodium: 139 meq/L (ref 135–145)

## 2024-07-18 LAB — MICROALBUMIN / CREATININE URINE RATIO
Creatinine,U: 35.3 mg/dL
Microalb Creat Ratio: UNDETERMINED mg/g (ref 0.0–30.0)
Microalb, Ur: <0.7 mg/dL

## 2024-07-18 LAB — HEMOGLOBIN A1C: Hgb A1c MFr Bld: 6.2 % (ref 4.6–6.5)

## 2024-08-06 DIAGNOSIS — E119 Type 2 diabetes mellitus without complications: Secondary | ICD-10-CM | POA: Diagnosis not present

## 2024-08-13 ENCOUNTER — Other Ambulatory Visit (HOSPITAL_BASED_OUTPATIENT_CLINIC_OR_DEPARTMENT_OTHER): Payer: Self-pay

## 2024-09-05 DIAGNOSIS — E119 Type 2 diabetes mellitus without complications: Secondary | ICD-10-CM | POA: Diagnosis not present

## 2024-09-09 ENCOUNTER — Other Ambulatory Visit (HOSPITAL_BASED_OUTPATIENT_CLINIC_OR_DEPARTMENT_OTHER): Payer: Self-pay

## 2024-10-06 DIAGNOSIS — E119 Type 2 diabetes mellitus without complications: Secondary | ICD-10-CM | POA: Diagnosis not present

## 2024-10-17 ENCOUNTER — Other Ambulatory Visit (HOSPITAL_BASED_OUTPATIENT_CLINIC_OR_DEPARTMENT_OTHER): Payer: Self-pay

## 2024-10-17 ENCOUNTER — Ambulatory Visit: Admitting: Family

## 2024-10-17 VITALS — BP 104/66 | HR 76 | Temp 97.8°F | Resp 16 | Ht 71.0 in | Wt 278.0 lb

## 2024-10-17 DIAGNOSIS — I10 Essential (primary) hypertension: Secondary | ICD-10-CM

## 2024-10-17 DIAGNOSIS — E785 Hyperlipidemia, unspecified: Secondary | ICD-10-CM

## 2024-10-17 DIAGNOSIS — Z7985 Long-term (current) use of injectable non-insulin antidiabetic drugs: Secondary | ICD-10-CM | POA: Diagnosis not present

## 2024-10-17 DIAGNOSIS — E119 Type 2 diabetes mellitus without complications: Secondary | ICD-10-CM

## 2024-10-17 MED ORDER — TIRZEPATIDE 10 MG/0.5ML ~~LOC~~ SOAJ
10.0000 mg | SUBCUTANEOUS | 3 refills | Status: AC
  Filled 2024-10-17: qty 2, 28d supply, fill #0
  Filled 2024-11-09: qty 2, 28d supply, fill #1
  Filled 2024-12-07: qty 2, 28d supply, fill #2
  Filled 2025-01-10: qty 2, 28d supply, fill #3

## 2024-10-17 NOTE — Progress Notes (Signed)
 Subjective:     Patient ID: Angel Merritt, male    DOB: [birth year], 58 y.o.   MRN: [id number]  Chief Complaint  Patient presents with   Diabetes    Here for follow up     Diabetes    Discussed the use of AI scribe software for clinical note transcription with the patient, who gave verbal consent to proceed.  History of Present Illness  Patient is a 58 yr old male who presents today for follow up.  He is tolerating mounjaro , but weight loss progress has slowed. Last A1C was at goal.  He continues atorvastatin  for cholesterol.  Using Pepcid daily- gerd symptoms well controlled.     There are no preventive care reminders to display for this patient.  Past Medical History:  Diagnosis Date   B12 deficiency 09/07/2020   Diabetes mellitus type 2, controlled (HCC) 09/09/2021   ED (erectile dysfunction)    GERD (gastroesophageal reflux disease)    Hyperlipidemia    OA (osteoarthritis)    OSA (obstructive sleep apnea) 10/15/2021   Umbilical hernia    Varicose veins    Wears glasses     Past Surgical History:  Procedure Laterality Date   COLONOSCOPY WITH PROPOFOL   11-07-2020  dr shila   ENDOVENOUS ABLATION SAPHENOUS VEIN W/ LASER Left 11-06-2015   endovenous laser ablation left greater saphenous vein and stab phlebectomies > 20 incisions  left leg  by Krystal Doing MD   UMBILICAL HERNIA REPAIR N/A 11/21/2020   Procedure: LAPAROSCOPIC UMBILICAL HERNIA WITH MESH;  Surgeon: Rubin Calamity, MD;  Location: [facility] [place];  Service: General;  Laterality: N/A;    Family History  Problem Relation Age of Onset   Coronary artery disease Mother 36       (has 3 stents)   Heart disease Mother    Colon cancer Father 29       died at 76 (following a hernia repair)   Cancer Father    Coronary artery disease Maternal Grandmother 34       smoker   Cancer Paternal Grandfather        brain tumor   Esophageal cancer Neg Hx    Stomach cancer Neg Hx    Rectal cancer Neg  Hx     Social History   Socioeconomic History   Marital status: Married    Spouse name: Not on file   Number of children: Not on file   Years of education: Not on file   Highest education level: Associate degree: occupational, scientist, product/process development, or vocational program  Occupational History   Not on file  Tobacco Use   Smoking status: Never   Smokeless tobacco: Never  Vaping Use   Vaping status: Never Used  Substance and Sexual Activity   Alcohol use: Not Currently   Drug use: Never   Sexual activity: Not on file    Comment: vasectomy  2017  Other Topics Concern   Not on file  Social History Narrative   4 children- 6 (daughter), 61 (daughter), 8 son, 5 son   Works as curator   Married   Enjoys baseball, golf, gym   Social Drivers of Health   Financial Resource Strain: Patient Declined (10/16/2024)   Overall Financial Resource Strain (CARDIA)    Difficulty of Paying Living Expenses: Patient declined  Food Insecurity: Patient Declined (10/16/2024)   Hunger Vital Sign    Worried About Running Out of Food in the Last Year: Patient declined    Ran Out  of Food in the Last Year: Patient declined  Transportation Needs: Patient Declined (10/16/2024)   PRAPARE - Administrator, Civil Service (Medical): Patient declined    Lack of Transportation (Non-Medical): Patient declined  Physical Activity: Sufficiently Active (10/16/2024)   Exercise Vital Sign    Days of Exercise per Week: 6 days    Minutes of Exercise per Session: 60 min  Stress: No Stress Concern Present (10/16/2024)   [employer] of Occupational Health - Occupational Stress Questionnaire    Feeling of Stress: Not at all  Social Connections: Moderately Isolated (10/16/2024)   Social Connection and Isolation Panel    Frequency of Communication with Friends and Family: More than three times a week    Frequency of Social Gatherings with Friends and Family: Once a week    Attends Religious Services: Patient  declined    Database Administrator or Organizations: No    Attends Engineer, Structural: Not on file    Marital Status: Married  Intimate Partner Violence: Unknown (03/08/2022)   Received from [facility]   HITS    Physically Hurt: Not on file    Insult or Talk Down To: Not on file    Threaten Physical Harm: Not on file    Scream or Curse: Not on file    Outpatient Medications Prior to Visit  Medication Sig Dispense Refill   acetaminophen  (TYLENOL ) 500 MG tablet Take 500 mg by mouth every 6 (six) hours as needed.     atorvastatin  (LIPITOR) 20 MG tablet TAKE 1 TABLET BY MOUTH EVERY DAY 90 tablet 1   Blood Glucose Monitoring Suppl (CONTOUR NEXT MONITOR) w/Device KIT Check blood sugars daily 1 kit 0   famotidine (PEPCID) 10 MG tablet Take 10 mg by mouth as needed for heartburn or indigestion.     metFORMIN  (GLUCOPHAGE ) 500 MG tablet TAKE 1 TABLET BY MOUTH 2 TIMES DAILY WITH A MEAL. 180 tablet 1   tirzepatide  (MOUNJARO ) 7.5 MG/0.5ML Pen Inject 7.5 mg into the skin once a week. 2 mL 2   No facility-administered medications prior to visit.    Allergies  Allergen Reactions   Effexor  [Venlafaxine ] Other (See Comments)    Severe daytime somnolence, head ache and weight gain   Lexapro  [Escitalopram  Oxalate]     Weight gain    ROS    See HPI Objective:    Physical Exam Constitutional:      General: He is not in acute distress.    Appearance: He is well-developed.  HENT:     Head: Normocephalic and atraumatic.  Cardiovascular:     Rate and Rhythm: Normal rate and regular rhythm.     Heart sounds: No murmur heard. Pulmonary:     Effort: Pulmonary effort is normal. No respiratory distress.     Breath sounds: Normal breath sounds. No wheezing or rales.  Skin:    General: Skin is warm and dry.  Neurological:     Mental Status: He is alert and oriented to person, place, and time.  Psychiatric:        Behavior: Behavior normal.        Thought Content: Thought content  normal.      BP 104/66 (BP Location: Left Arm, Patient Position: Sitting, Cuff Size: Large)   Pulse 76   Temp 97.8 F (36.6 C) (Oral)   Resp 16   Ht 5' 11 (1.803 m)   Wt 278 lb (126.1 kg)   SpO2 98%   BMI  38.77 kg/m  Wt Readings from Last 3 Encounters:  10/17/24 278 lb (126.1 kg)  07/17/24 277 lb (125.6 kg)  04/11/24 281 lb (127.5 kg)       Assessment & Plan:   Problem List Items Addressed This Visit       Unprioritized   Hyperlipidemia   Lab Results  Component Value Date   CHOL 149 04/11/2024   HDL 39.70 04/11/2024   LDLCALC 81 04/11/2024   LDLDIRECT 167.0 11/24/2022   TRIG 142.0 04/11/2024   CHOLHDL 4 04/11/2024   Update Lipids- goal LDL <70. Continues atorvastatin .        Relevant Orders   Lipid panel   Essential hypertension   BP Readings from Last 3 Encounters:  10/17/24 104/66  07/17/24 124/77  04/11/24 109/65   BP is stable without medication. Monitor.        Controlled type 2 diabetes mellitus without complication, without long-term current use of insulin (HCC) - Primary   Lab Results  Component Value Date   HGBA1C 6.2 07/17/2024   HGBA1C 6.4 04/11/2024   HGBA1C 8.4 (H) 12/13/2023   Lab Results  Component Value Date   MICROALBUR <0.7 07/17/2024   LDLCALC 81 04/11/2024   CREATININE 1.12 07/17/2024  A1C has been good, his weight loss has stopped. Will increase to 10mg  to see if this helps more with weight loss. Update A1C today.        Relevant Medications   tirzepatide  (MOUNJARO ) 10 MG/0.5ML Pen   Other Relevant Orders   Basic Metabolic Panel (BMET)   HgB A1c    I have discontinued Lamar Eke Dean's tirzepatide . I am also having him start on tirzepatide . Additionally, I am having him maintain his famotidine, Contour Next Monitor, acetaminophen , metFORMIN , and atorvastatin .  Meds ordered this encounter  Medications   tirzepatide  (MOUNJARO ) 10 MG/0.5ML Pen    Sig: Inject 10 mg into the skin once a week.    Dispense:  2 mL     Refill:  3    Supervising Provider:   DOMENICA BLACKBIRD A [[id number]]

## 2024-10-17 NOTE — Patient Instructions (Addendum)
 Please increase mounjaro  from 7.5 mg to 10 mg.                         Contains text generated by [employer].                                 Contains text generated by [employer].

## 2024-10-17 NOTE — Assessment & Plan Note (Signed)
 Lab Results  Component Value Date   HGBA1C 6.2 07/17/2024   HGBA1C 6.4 04/11/2024   HGBA1C 8.4 (H) 12/13/2023   Lab Results  Component Value Date   MICROALBUR <0.7 07/17/2024   LDLCALC 81 04/11/2024   CREATININE 1.12 07/17/2024  A1C has been good, his weight loss has stopped. Will increase to 10mg  to see if this helps more with weight loss. Update A1C today.

## 2024-10-17 NOTE — Assessment & Plan Note (Signed)
 Lab Results  Component Value Date   CHOL 149 04/11/2024   HDL 39.70 04/11/2024   LDLCALC 81 04/11/2024   LDLDIRECT 167.0 11/24/2022   TRIG 142.0 04/11/2024   CHOLHDL 4 04/11/2024   Update Lipids- goal LDL <70. Continues atorvastatin .

## 2024-10-17 NOTE — Assessment & Plan Note (Signed)
 BP Readings from Last 3 Encounters:  10/17/24 104/66  07/17/24 124/77  04/11/24 109/65   BP is stable without medication. Monitor.

## 2024-10-17 NOTE — Assessment & Plan Note (Signed)
 Continues pepcid ane reports gerd is stable. May trial

## 2024-10-18 ENCOUNTER — Ambulatory Visit: Payer: Self-pay | Admitting: Family

## 2024-10-18 DIAGNOSIS — E785 Hyperlipidemia, unspecified: Secondary | ICD-10-CM

## 2024-10-18 LAB — HEMOGLOBIN A1C: Hgb A1c MFr Bld: 6.3 % (ref 4.6–6.5)

## 2024-10-18 LAB — LIPID PANEL
Cholesterol: 162 mg/dL (ref <200)
HDL: 46 mg/dL (ref >=40)
LDL Cholesterol (Calc): 86 mg/dL
Non-HDL Cholesterol (Calc): 116 mg/dL (ref <130)
Total CHOL/HDL Ratio: 3.5 (calc) (ref <5.0)
Triglycerides: 204 mg/dL — ABNORMAL HIGH (ref <150)

## 2024-10-18 LAB — BASIC METABOLIC PANEL WITH GFR
BUN: 20 mg/dL (ref 6–23)
CO2: 28 meq/L (ref 19–32)
Calcium: 9.6 mg/dL (ref 8.4–10.5)
Chloride: 100 meq/L (ref 96–112)
Creatinine, Ser: 1.04 mg/dL (ref 0.40–1.50)
GFR: 80.47 mL/min (ref >60.00)
Glucose, Bld: 97 mg/dL (ref 70–99)
Potassium: 4.3 meq/L (ref 3.5–5.1)
Sodium: 137 meq/L (ref 135–145)

## 2024-10-18 MED ORDER — ROSUVASTATIN CALCIUM 20 MG PO TABS: 20.0000 mg | ORAL_TABLET | Freq: Every day | ORAL | 1 refills | Status: AC

## 2024-10-18 NOTE — Telephone Encounter (Signed)
 Cholesterol is slightly above goal. Stop lipitor, start crestor  which is a bit stronger.  Sugar looks great- keep up the good work.

## 2024-10-22 NOTE — Progress Notes (Signed)
 Patient notified of results and change on medication

## 2024-12-09 ENCOUNTER — Other Ambulatory Visit: Payer: Self-pay | Admitting: Family

## 2024-12-18 LAB — OPHTHALMOLOGY REPORT-SCANNED

## 2025-01-16 ENCOUNTER — Ambulatory Visit: Admitting: Family
